# Patient Record
Sex: Male | Born: 1935 | Race: White | Hispanic: No | Smoking: Never smoker
Health system: Southern US, Community
[De-identification: ages and names within clinical notes are randomized; demographics above are authoritative.]

## PROBLEM LIST (undated history)

## (undated) DIAGNOSIS — H269 Unspecified cataract: Secondary | ICD-10-CM

## (undated) DIAGNOSIS — I639 Cerebral infarction, unspecified: Secondary | ICD-10-CM

## (undated) DIAGNOSIS — H579 Unspecified disorder of eye and adnexa: Secondary | ICD-10-CM

## (undated) DIAGNOSIS — I10 Essential (primary) hypertension: Secondary | ICD-10-CM

## (undated) DIAGNOSIS — E119 Type 2 diabetes mellitus without complications: Secondary | ICD-10-CM

## (undated) DIAGNOSIS — E785 Hyperlipidemia, unspecified: Secondary | ICD-10-CM

## (undated) DIAGNOSIS — F0390 Unspecified dementia without behavioral disturbance: Secondary | ICD-10-CM

## (undated) DIAGNOSIS — R32 Unspecified urinary incontinence: Secondary | ICD-10-CM

## (undated) DIAGNOSIS — J302 Other seasonal allergic rhinitis: Secondary | ICD-10-CM

## (undated) HISTORY — PX: CATARACT EXTRACTION: SUR2

---

## 1988-01-16 DIAGNOSIS — I639 Cerebral infarction, unspecified: Secondary | ICD-10-CM

## 1988-01-16 HISTORY — DX: Cerebral infarction, unspecified: I63.9

## 2011-01-01 ENCOUNTER — Emergency Department: Payer: Self-pay | Admitting: *Deleted

## 2011-05-08 ENCOUNTER — Ambulatory Visit: Payer: Self-pay | Admitting: Ophthalmology

## 2011-05-08 DIAGNOSIS — Z01810 Encounter for preprocedural cardiovascular examination: Secondary | ICD-10-CM

## 2011-05-22 ENCOUNTER — Ambulatory Visit: Payer: Self-pay | Admitting: Ophthalmology

## 2011-05-30 ENCOUNTER — Ambulatory Visit: Payer: Self-pay | Admitting: Ophthalmology

## 2011-07-04 ENCOUNTER — Ambulatory Visit: Payer: Self-pay | Admitting: Family Medicine

## 2011-10-16 ENCOUNTER — Emergency Department (HOSPITAL_COMMUNITY): Payer: Medicare Other

## 2011-10-16 ENCOUNTER — Inpatient Hospital Stay (HOSPITAL_COMMUNITY)
Admission: EM | Admit: 2011-10-16 | Discharge: 2011-10-18 | DRG: 069 | Disposition: A | Discharge status: Home Health | Payer: Medicare Other | Attending: Internal Medicine | Admitting: Internal Medicine

## 2011-10-16 ENCOUNTER — Encounter (HOSPITAL_COMMUNITY): Payer: Self-pay | Admitting: Physician Assistant

## 2011-10-16 DIAGNOSIS — A499 Bacterial infection, unspecified: Secondary | ICD-10-CM | POA: Diagnosis present

## 2011-10-16 DIAGNOSIS — Z79899 Other long term (current) drug therapy: Secondary | ICD-10-CM

## 2011-10-16 DIAGNOSIS — I10 Essential (primary) hypertension: Secondary | ICD-10-CM | POA: Diagnosis present

## 2011-10-16 DIAGNOSIS — B9689 Other specified bacterial agents as the cause of diseases classified elsewhere: Secondary | ICD-10-CM | POA: Diagnosis present

## 2011-10-16 DIAGNOSIS — E785 Hyperlipidemia, unspecified: Secondary | ICD-10-CM | POA: Diagnosis present

## 2011-10-16 DIAGNOSIS — Z7902 Long term (current) use of antithrombotics/antiplatelets: Secondary | ICD-10-CM

## 2011-10-16 DIAGNOSIS — J302 Other seasonal allergic rhinitis: Secondary | ICD-10-CM | POA: Diagnosis present

## 2011-10-16 DIAGNOSIS — R339 Retention of urine, unspecified: Secondary | ICD-10-CM

## 2011-10-16 DIAGNOSIS — H269 Unspecified cataract: Secondary | ICD-10-CM | POA: Diagnosis present

## 2011-10-16 DIAGNOSIS — R32 Unspecified urinary incontinence: Secondary | ICD-10-CM | POA: Diagnosis present

## 2011-10-16 DIAGNOSIS — E119 Type 2 diabetes mellitus without complications: Secondary | ICD-10-CM | POA: Diagnosis present

## 2011-10-16 DIAGNOSIS — H10029 Other mucopurulent conjunctivitis, unspecified eye: Secondary | ICD-10-CM | POA: Diagnosis present

## 2011-10-16 DIAGNOSIS — I639 Cerebral infarction, unspecified: Secondary | ICD-10-CM | POA: Diagnosis present

## 2011-10-16 DIAGNOSIS — Z8673 Personal history of transient ischemic attack (TIA), and cerebral infarction without residual deficits: Secondary | ICD-10-CM

## 2011-10-16 DIAGNOSIS — H109 Unspecified conjunctivitis: Secondary | ICD-10-CM

## 2011-10-16 DIAGNOSIS — I6350 Cerebral infarction due to unspecified occlusion or stenosis of unspecified cerebral artery: Secondary | ICD-10-CM

## 2011-10-16 DIAGNOSIS — Z7982 Long term (current) use of aspirin: Secondary | ICD-10-CM

## 2011-10-16 DIAGNOSIS — G459 Transient cerebral ischemic attack, unspecified: Principal | ICD-10-CM

## 2011-10-16 DIAGNOSIS — H579 Unspecified disorder of eye and adnexa: Secondary | ICD-10-CM | POA: Diagnosis present

## 2011-10-16 DIAGNOSIS — F0390 Unspecified dementia without behavioral disturbance: Secondary | ICD-10-CM | POA: Diagnosis present

## 2011-10-16 DIAGNOSIS — D649 Anemia, unspecified: Secondary | ICD-10-CM

## 2011-10-16 HISTORY — DX: Essential (primary) hypertension: I10

## 2011-10-16 HISTORY — DX: Hyperlipidemia, unspecified: E78.5

## 2011-10-16 HISTORY — DX: Cerebral infarction, unspecified: I63.9

## 2011-10-16 HISTORY — DX: Unspecified dementia, unspecified severity, without behavioral disturbance, psychotic disturbance, mood disturbance, and anxiety: F03.90

## 2011-10-16 HISTORY — DX: Unspecified urinary incontinence: R32

## 2011-10-16 HISTORY — DX: Unspecified cataract: H26.9

## 2011-10-16 HISTORY — DX: Unspecified disorder of eye and adnexa: H57.9

## 2011-10-16 HISTORY — DX: Type 2 diabetes mellitus without complications: E11.9

## 2011-10-16 HISTORY — DX: Other seasonal allergic rhinitis: J30.2

## 2011-10-16 LAB — CBC WITH DIFFERENTIAL/PLATELET
Basophils Relative: 0 % (ref 0–1)
Eosinophils Absolute: 0.2 10*3/uL (ref 0.0–0.7)
Hemoglobin: 11.3 g/dL — ABNORMAL LOW (ref 13.0–17.0)
Lymphs Abs: 2 10*3/uL (ref 0.7–4.0)
MCH: 30 pg (ref 26.0–34.0)
MCHC: 33.4 g/dL (ref 30.0–36.0)
Neutro Abs: 6.7 10*3/uL (ref 1.7–7.7)
Neutrophils Relative %: 69 % (ref 43–77)
Platelets: 228 10*3/uL (ref 150–400)
RBC: 3.77 MIL/uL — ABNORMAL LOW (ref 4.22–5.81)

## 2011-10-16 LAB — URINALYSIS, ROUTINE W REFLEX MICROSCOPIC
Bilirubin Urine: NEGATIVE
Glucose, UA: NEGATIVE mg/dL
Hgb urine dipstick: NEGATIVE
Protein, ur: NEGATIVE mg/dL
Urobilinogen, UA: 1 mg/dL (ref 0.0–1.0)

## 2011-10-16 LAB — GLUCOSE, CAPILLARY: Glucose-Capillary: 91 mg/dL (ref 70–99)

## 2011-10-16 LAB — COMPREHENSIVE METABOLIC PANEL
ALT: 21 U/L (ref 0–53)
Albumin: 3.8 g/dL (ref 3.5–5.2)
Alkaline Phosphatase: 73 U/L (ref 39–117)
Chloride: 103 mEq/L (ref 96–112)
Potassium: 4.1 mEq/L (ref 3.5–5.1)
Sodium: 141 mEq/L (ref 135–145)
Total Bilirubin: 0.4 mg/dL (ref 0.3–1.2)
Total Protein: 6.2 g/dL (ref 6.0–8.3)

## 2011-10-16 MED ORDER — CLOPIDOGREL BISULFATE 75 MG PO TABS
75.0000 mg | ORAL_TABLET | Freq: Every day | ORAL | Status: DC
  Administered 2011-10-17 – 2011-10-18 (×2): 75 mg via ORAL
  Filled 2011-10-16 (×2): qty 1

## 2011-10-16 MED ORDER — ASPIRIN EC 81 MG PO TBEC
81.0000 mg | DELAYED_RELEASE_TABLET | Freq: Every day | ORAL | Status: DC
  Administered 2011-10-17 – 2011-10-18 (×2): 81 mg via ORAL
  Filled 2011-10-16 (×2): qty 1

## 2011-10-16 MED ORDER — INSULIN ASPART 100 UNIT/ML ~~LOC~~ SOLN
0.0000 [IU] | Freq: Three times a day (TID) | SUBCUTANEOUS | Status: DC
  Administered 2011-10-17: 2 [IU] via SUBCUTANEOUS

## 2011-10-16 MED ORDER — SODIUM CHLORIDE 0.9 % IV SOLN: INTRAVENOUS | Status: AC

## 2011-10-16 MED ORDER — LORATADINE 10 MG PO TABS
10.0000 mg | ORAL_TABLET | Freq: Every day | ORAL | Status: DC
  Administered 2011-10-17 – 2011-10-18 (×2): 10 mg via ORAL
  Filled 2011-10-16 (×2): qty 1

## 2011-10-16 MED ORDER — CARVEDILOL 6.25 MG PO TABS
6.2500 mg | ORAL_TABLET | Freq: Two times a day (BID) | ORAL | Status: DC
  Administered 2011-10-17 – 2011-10-18 (×2): 6.25 mg via ORAL
  Filled 2011-10-16 (×5): qty 1

## 2011-10-16 MED ORDER — CITALOPRAM HYDROBROMIDE 20 MG PO TABS
20.0000 mg | ORAL_TABLET | Freq: Every day | ORAL | Status: DC
  Administered 2011-10-17 – 2011-10-18 (×2): 20 mg via ORAL
  Filled 2011-10-16 (×2): qty 1

## 2011-10-16 MED ORDER — SENNOSIDES-DOCUSATE SODIUM 8.6-50 MG PO TABS: 1.0000 | ORAL_TABLET | Freq: Every evening | ORAL | Status: DC | PRN

## 2011-10-16 MED ORDER — HYDRALAZINE HCL 20 MG/ML IJ SOLN
5.0000 mg | INTRAMUSCULAR | Status: DC | PRN
  Filled 2011-10-16: qty 0.25

## 2011-10-16 MED ORDER — ACETAMINOPHEN 325 MG PO TABS: 650.0000 mg | ORAL_TABLET | ORAL | Status: DC | PRN

## 2011-10-16 MED ORDER — ADULT MULTIVITAMIN W/MINERALS CH
1.0000 | ORAL_TABLET | Freq: Every day | ORAL | Status: DC
  Administered 2011-10-17 – 2011-10-18 (×2): 1 via ORAL
  Filled 2011-10-16 (×2): qty 1

## 2011-10-16 MED ORDER — ONDANSETRON HCL 4 MG/2ML IJ SOLN: 4.0000 mg | Freq: Four times a day (QID) | INTRAMUSCULAR | Status: DC | PRN

## 2011-10-16 MED ORDER — ACETAMINOPHEN 650 MG RE SUPP: 650.0000 mg | RECTAL | Status: DC | PRN

## 2011-10-16 MED ORDER — LISINOPRIL 5 MG PO TABS: 5.0000 mg | ORAL_TABLET | Freq: Every day | ORAL | Status: DC

## 2011-10-16 MED ORDER — ATORVASTATIN CALCIUM 20 MG PO TABS
20.0000 mg | ORAL_TABLET | Freq: Every day | ORAL | Status: DC
  Administered 2011-10-16 – 2011-10-17 (×2): 20 mg via ORAL
  Filled 2011-10-16 (×3): qty 1

## 2011-10-16 MED ORDER — POLYMYXIN B-TRIMETHOPRIM 10000-0.1 UNIT/ML-% OP SOLN
2.0000 [drp] | OPHTHALMIC | Status: DC
  Administered 2011-10-16 – 2011-10-18 (×7): 2 [drp] via OPHTHALMIC
  Filled 2011-10-16: qty 10

## 2011-10-16 NOTE — ED Notes (Signed)
Per family, pt has been drinking diet coke all day but has had nothing to eat and has had none of his medications today.  CBG to be taken.

## 2011-10-16 NOTE — Progress Notes (Signed)
Disposition Note  Christopher Pineda, is a 78 y.o. male,   MRN: [id number]  -  DOB - Dec 19, 1926  Outpatient Primary MD for the patient is No primary provider on file.   Blood pressure 170/106, pulse 65, temperature 98 F (36.7 C), temperature source Oral, resp. rate 18, height 5\' 9"  (1.753 m), weight 77.111 kg (170 lb), SpO2 96.00%.  Active Problems:  * No active hospital problems. *     78 yo from [place] with history of prior stroke and dementia.  Family lives next door to him and cares for him.  They called EMS today as he was not making sense in conversation and was disorientated.  He also had right sided weakness.  Per the EDP these symptoms have been transient.  CT head shows multiple areas of remote water shed infarcts and possible cervical vascular compromise.    I have requested a tele bed for TIA work up and possibly MRI brain and neck.   Algis Downs, PA-C [facility] Pager: [phone]

## 2011-10-16 NOTE — ED Notes (Addendum)
MD in room at this time. Family arrived. Family reports pt had increase in confusion and right side weakness earlier this AM that s/s lasted "few minutes" then resolved.

## 2011-10-16 NOTE — ED Notes (Signed)
Called flow.  She stated Dr Malachi Bonds to see pt.  She is calling RE when pt will see pt.

## 2011-10-16 NOTE — ED Provider Notes (Signed)
History     CSN: [id number]  Arrival date & time 10/16/11  1043   First MD Initiated Contact with Patient 10/16/11 1100      Chief Complaint  Patient presents with  . Altered Mental Status    (Consider location/radiation/quality/duration/timing/severity/associated sxs/prior treatment) Patient is a 78 y.o. male presenting with altered mental status. The history is provided by the patient and a relative.  Altered Mental Status   patient here with altered mental status consisting of right-sided weakness and confusion which is been transient today. Symptoms lasted for approximate one hour and consisted of ataxia with confusion and right-sided weakness. History of CVA 15 years ago. Also has a history of Alzheimer's and according to the family patient's dementia symptoms wax and wane. They note recent strong smelling urine. Denies any recent fevers or chills or vomiting. EMS was called and patient transported here  No past medical history on file.  No past surgical history on file.  No family history on file.  History  Substance Use Topics  . Smoking status: Not on file  . Smokeless tobacco: Not on file  . Alcohol Use: Not on file      Review of Systems  Psychiatric/Behavioral: Positive for altered mental status.  All other systems reviewed and are negative.    Allergies  Review of patient's allergies indicates not on file.  Home Medications  No current outpatient prescriptions on file.  BP 181/67  Pulse 65  Temp 98.2 F (36.8 C) (Oral)  Resp 18  Ht 5\' 9"  (1.753 m)  Wt 170 lb (77.111 kg)  BMI 25.10 kg/m2  SpO2 100%  Physical Exam  Nursing note and vitals reviewed. Constitutional: He appears well-developed and well-nourished.  Non-toxic appearance. No distress.  HENT:  Head: Normocephalic and atraumatic.  Eyes: Conjunctivae normal, EOM and lids are normal. Pupils are equal, round, and reactive to light.  Neck: Normal range of motion. Neck supple. No tracheal  deviation present. No mass present.  Cardiovascular: Normal rate, regular rhythm and normal heart sounds.  Exam reveals no gallop.   No murmur heard. Pulmonary/Chest: Effort normal and breath sounds normal. No stridor. No respiratory distress. He has no decreased breath sounds. He has no wheezes. He has no rhonchi. He has no rales.  Abdominal: Soft. Normal appearance and bowel sounds are normal. He exhibits no distension. There is no tenderness. There is no rebound and no CVA tenderness.  Musculoskeletal: Normal range of motion. He exhibits no edema and no tenderness.  Neurological: He is alert. He displays no tremor. No cranial nerve deficit or sensory deficit. He displays no seizure activity. GCS eye subscore is 4. GCS verbal subscore is 5. GCS motor subscore is 6.  Skin: Skin is warm and dry. No abrasion and no rash noted.  Psychiatric: His affect is blunt. His speech is not slurred. He is slowed. He is communicative.    ED Course  Procedures (including critical care time)   Labs Reviewed  URINALYSIS, ROUTINE W REFLEX MICROSCOPIC  URINE CULTURE  CBC WITH DIFFERENTIAL  COMPREHENSIVE METABOLIC PANEL  PROTIME-INR   No results found.   No diagnosis found.    MDM   Date: 10/16/2011  Rate: 64  Rhythm: normal sinus rhythm  QRS Axis: normal  Intervals: normal  ST/T Wave abnormalities: normal  Conduction Disutrbances:none  Narrative Interpretation:   Old EKG Reviewed: none available  4:49 PM Patient to be admitted for evaluation of possible TIA like symptoms  Toy Baker, MD 10/16/11 [phone]

## 2011-10-16 NOTE — ED Notes (Signed)
CBG 91; RN aware 

## 2011-10-16 NOTE — ED Notes (Addendum)
Pt to CT/XR.

## 2011-10-16 NOTE — ED Notes (Addendum)
Pt refused rectal temp and given diet drink

## 2011-10-16 NOTE — ED Notes (Addendum)
EMS called by pt daughter for AMS. Daughter reported pt was "normal" last night when she last saw him and when she went to pt home this AM pt was "not right". She reported pt was not making sense with conversation. Pt has dementia.  Pt alert and disoriented upon ED arrival. No family with pt

## 2011-10-16 NOTE — H&P (Addendum)
[facility] History and Physical  Christopher Pineda ZOX:[id number] DOB: 10-12-1926 DOA: 10/16/2011  Referring physician:  Dr. Freida Busman PCP: No primary provider on file.   Dr. Ramond [facility]   Chief Complaint: Altered mental status  HPI:  The patient is an 78 year old male with HTN, HLD, T2DM, dementia, and history of CVA who presents with slurred speech, and generalized weakness.  Last night the patient was at his baseline of A&Ox2.  Per family, he normally has to be woken up in the morning, but this morning, when they came over, he was sitting out on the porch and his speech was very difficult to understand.  Daughter handed him coffee, but he could not hold his coffee properly - it kept tipping.  His son came over and by that time his speech was even worse so they called 911.  When EMS arrived, his speech was very slurred and he was unable to hold up both arms.  No facial droop.  His gait was lurching and he seemed like he was going to fall.  He needed assistance getting his shoes on.  EMS stated that his blood pressure was normal and he started to get better even while they were getting him in the ambulance.    When he arrived in the ER, he was laughing and talking and speaking normally and he has been fine okay.  He had a head CT which showed a multitude of infarcts including watershed infarcts which appeared remote.  His electrolytes were notable for an elevated BUN and mild normocytic anemia.  His urinalysis was unremarkable, but he was retaining of urine and had a foley catheter placed.  EKG was NSR.    Review of Systems:   He states he has been feeling well.  Denies fevers, chills, weight loss, night sweats, cold symptoms.  He has chronic eye irritation from his eyelashes turning inward with purulent discharge and interference with vision.  Denies hearing problems.  Denies chest pain, shortness of breath, nausea, vomiting, diarrhea, abdominal pain, constipation, blood  in stools, melena.  His urine smells very strong.  He is incontinent at night and voids rarely during the day.  He wears depends.  Denies rashes, ulcers, lymphadenopathy, abnormal bruising or bleeding, depression, anxiety.    Past Medical History  Diagnosis Date  . CVA (cerebral infarction) 1990  . Hypertension   . Hyperlipidemia   . Diabetes mellitus type 2 in nonobese   . Seasonal allergies   . Cataracts, bilateral   . Eye disease     eyelashes rub against eye and cause irritation  . Incontinence of urine   . Dementia     Past Surgical History  Procedure Date  . Cataract extraction    Social History:  reports that he has never smoked. He has quit using smokeless tobacco. His smokeless tobacco use included Chew. He reports that he does not drink alcohol or use illicit drugs.  He has baseline dementia and is usually confused in the morning.  He is oriented usually to person, place, but he does not know day or year.  He uses a cane to ambulate.  He lives alone and his son stays with him at night.  He has wandered before.  Son lives right next door and the rest of the family lives near him.  He does not cook and he is brought food by his family.     No Known Allergies  Family History  Problem Relation Age  of Onset  . Heart attack Mother   . Cancer Sister   . Alzheimer's disease Sister   . Alzheimer's disease Sister   . Alzheimer's disease Brother   . Cancer Daughter        Prior to Admission medications   Medication Sig Start Date End Date Taking? Authorizing Provider  aspirin EC 81 MG tablet Take 81 mg by mouth daily.   Yes Historical Provider, MD  atorvastatin (LIPITOR) 20 MG tablet Take 20 mg by mouth at bedtime.   Yes Historical Provider, MD  carvedilol (COREG) 6.25 MG tablet Take 6.25 mg by mouth 2 (two) times daily with a meal.   Yes Historical Provider, MD  cetirizine (ZYRTEC) 10 MG tablet Take 10 mg by mouth daily.   Yes Historical Provider, MD  citalopram (CELEXA)  20 MG tablet Take 20 mg by mouth daily.   Yes Historical Provider, MD  clopidogrel (PLAVIX) 75 MG tablet Take 75 mg by mouth daily.   Yes Historical Provider, MD  metFORMIN (GLUCOPHAGE) 1000 MG tablet Take 1,000 mg by mouth 2 (two) times daily with a meal.   Yes Historical Provider, MD  Multiple Vitamin (MULTIVITAMIN WITH MINERALS) TABS Take 1 tablet by mouth daily.   Yes Historical Provider, MD  quinapril (ACCUPRIL) 10 MG tablet Take 10 mg by mouth at bedtime.   Yes Historical Provider, MD   Physical Exam: Filed Vitals:   10/16/11 1730 10/16/11 1830 10/16/11 1837 10/16/11 2054  BP: 109/72 173/126 109/72 168/75  Pulse: 59 58  62  Temp:    98.4 F (36.9 C)  TempSrc:    Oral  Resp: 16 14 16 16   Height:    5\' 9"  (1.753 m)  Weight:    68 kg (149 lb 14.6 oz)  SpO2: 94% 96% 97% 99%     General:  Caucasian male, no acute distress, lying on bed  Eyes:  Right eye with mild erythema of eyelid and patient unable to open eye without assistance.  Purulent discharge from eye and eyelashes turned under lower leg abrading corner and very injected.  Left eye has copious purulent drainage but is less injected.  Pupils are grossly symmetric and reactive to light, but difficult to assess.  EOMI intact left eye, but difficult to assess in right eye.    ENT:  Nares and oropharynx nonerythematous, no exudate or plaque. Edentulous  Neck: supple, bruits bilaterally versus radiation from heart, no thyromegaly or nodules  Cardiovascular: RRR, 3/6 c-d systolic murmur at RUSB with radiation to carotids, 2+ pulses  Respiratory: CTAB  Abdomen: NABS, soft, nondistended, fullness midline and to the left, without tenderness, rebound, or guardingi  Skin:  Rash that looks like seborrheic dermatitis on ears and eyelids.  Mild skin tenting.    Musculoskeletal: Normal tone and bulk  Psychiatric: A&O only to person.  Unsure date, year and location.  Says "bed" when asked where he is.  Smiling and  pleasant  Neurologic: No facial droop, tongue and palate midline, sensation intact on cheeks.  Strength 5/5 throughout, 2+ patellar reflexes, sensation intact to light touch.  Unable to assess finger to nose as patient cannot open right eye and has no depth perception.  Rapid finger tap is brisk and faster on the right than the left.  (right handed)  Labs on Admission:  Basic Metabolic Panel:  Lab 10/16/11 1610  NA 141  K 4.1  CL 103  CO2 30  GLUCOSE 134*  BUN 25*  CREATININE 1.04  CALCIUM  9.5  MG --  PHOS --   Liver Function Tests:  Lab 10/16/11 1112  AST 20  ALT 21  ALKPHOS 73  BILITOT 0.4  PROT 6.2  ALBUMIN 3.8   No results found for this basename: LIPASE:5,AMYLASE:5 in the last 168 hours No results found for this basename: AMMONIA:5 in the last 168 hours CBC:  Lab 10/16/11 1112  WBC 9.6  NEUTROABS 6.7  HGB 11.3*  HCT 33.8*  MCV 89.7  PLT 228   Cardiac Enzymes: No results found for this basename: CKTOTAL:5,CKMB:5,CKMBINDEX:5,TROPONINI:5 in the last 168 hours  BNP (last 3 results) No results found for this basename: PROBNP:3 in the last 8760 hours CBG:  Lab 10/16/11 2008 10/16/11 1542  GLUCAP 91 109*    Radiological Exams on Admission: Dg Chest 2 View  10/16/2011  *RADIOLOGY REPORT*  Clinical Data: Possible chest pain.  Confusion.  CHEST - 2 VIEW  Comparison: None.  Findings: Moderate thoracic spondylosis throughout. Lateral view degraded by patient arm position.  Numerous leads and wires project over the chest.  Remote left clavicular fracture.  Mild cardiomegaly with atherosclerosis in the transverse aorta. No pleural effusion or pneumothorax.  Clear lungs.  IMPRESSION: Mild cardiomegaly without acute disease.   Original Report Authenticated By: Consuello Bossier, M.D.    Ct Head Wo Contrast  10/16/2011  *RADIOLOGY REPORT*  Clinical Data: Episode of increased confusion and right-sided weakness which has since resolved.  Pain.  CT HEAD WITHOUT CONTRAST   Technique:  Contiguous axial images were obtained from the base of the skull through the vertex without contrast.  Comparison: None available.  Findings: Moderate generalized atrophy is present.  Extensive confluent white matter hypoattenuation is evident bilaterally, left greater than right.  There is some loss of gray-white differentiation within the anterior left frontal lobe, best seen on image 26 of series 2. Similar loss of gray-white differentiation and thinning is evident posteriorly, also best seen on image 26. There is some volume loss as well suggesting these are chronic findings.  A remote lacunar infarct is present in the right lentiform nucleus. The basal ganglia are grossly intact otherwise.  Insular cortex is intact bilaterally.  A remote lacunar infarct is evident within the right corona radiata.  There is chronic opacification of the right maxillary sinus. Posterior ethmoid air cells are also opacified on the right.  There is evidence of chronic disease without active disease in the right sphenoid sinus.  The mastoid air cells are clear.  The osseous skull is intact.  Atherosclerotic calcifications are evident in the cavernous carotid arteries bilaterally.  IMPRESSION:  1.  Asymmetric white matter disease and watershed type infarcts of the left frontal and parietal lobes.  These appear to be remote there is angle agent termini without comparison studies. 2.  Remote lacunar infarcts of the basal ganglia and right corona radiata. 3.  Atrophy and extensive white matter disease. 4.  Chronic sinus disease as described. 5.  The watershed type changes on the left suggest the possibility of the cervical vascular compromise.  If the patient has ongoing symptoms, MRI of the brain as well as MRA of the head and neck may be useful for further evaluation.   Original Report Authenticated By: Jamesetta Orleans. MATTERN, M.D.     EKG: NSR  Assessment/Plan Principal Problem:  *TIA (transient ischemic  attack) Active Problems:  CVA (cerebral infarction)  Hypertension  Hyperlipidemia  Diabetes mellitus type 2 in nonobese  Seasonal allergies  Cataracts, bilateral  Eye disease  Incontinence of urine  Dementia  Conjunctivitis  Urinary retention   TIA:  Patient has history of CVA and multiple risk factors for stroke.  Admit to rule out stroke and optimize risk factors.  No evidence of UTI on UA and patient was not orthostatic.   -  Neuro checks -  Telemetry -  MRI/MRA head -  Carotid dupex -  ECHO -  Continue ASA 81mg  and plavix 75 mg -  Lipid panel in AM -  A1c in AM -  Will need neurology consultation if acute stroke on MRI -  PT/OT/ST -  Advance diet as tolerated  Dementia:  -  TSH, B12, RPR.  -  Please consent family for HIV testing  Conjunctivitis:  Purulent and likely bacterial superinfection caused by chronic eye irritation from eyelashes -  Culture -  Polytrim -  Needs close outpatient ophthalmology follow up.  Urinary retention:   -  Voiding trial with PVR in AM -  Consider addition of fast-acting BPH med if retaining.  HTN:  Elevated blood pressures, then low.  Labile.   -  Hold ACEI tonight in setting of suspected acute stroke to allow permissive hypertension -  Consider restarting ACEI if blood pressures consistently elevated -  Continue beta blocker to prevent rebound tachycardia - Hydralazine prn  HLD:   -  F/u lipid panel -  Continue statin  DM:  Patient compliant with medications but noncompliant with diet.  On ACEI.   -  Hold metformin as getting imaging -  SSI   Anemia, normocytic, mild.  -  Trend  DIET:  Advance as tolerated to carbohydrate controlled, healthy heart ACCESS:  PIV IVF:  NS at 174ml/h (appears dry)  END at 10AM on 10/2 PROPH:  SCDS  Code Status: Full code per patient and family Family Communication:  Carlo Guevarra [phone], daughter-in-law is primary contact.  Did not speak with her, but spoke with two family members who  were present at bedside.   Disposition Plan: Pending completion of CVA work up  Time spent: 45 min  Renae Fickle [facility] Pager [phone]  If 7PM-7AM, please contact night-coverage [email or web address] Password [email or web address] 10/16/2011, 10:42 PM

## 2011-10-16 NOTE — ED Notes (Signed)
Dr Malachi Bonds stated to have EDP place temporary orders and pt can go upstairs.

## 2011-10-17 ENCOUNTER — Inpatient Hospital Stay (HOSPITAL_COMMUNITY): Payer: Medicare Other

## 2011-10-17 ENCOUNTER — Encounter (HOSPITAL_COMMUNITY): Payer: Self-pay | Admitting: *Deleted

## 2011-10-17 DIAGNOSIS — F0390 Unspecified dementia without behavioral disturbance: Secondary | ICD-10-CM

## 2011-10-17 DIAGNOSIS — E119 Type 2 diabetes mellitus without complications: Secondary | ICD-10-CM

## 2011-10-17 DIAGNOSIS — G459 Transient cerebral ischemic attack, unspecified: Principal | ICD-10-CM

## 2011-10-17 DIAGNOSIS — R339 Retention of urine, unspecified: Secondary | ICD-10-CM

## 2011-10-17 DIAGNOSIS — I517 Cardiomegaly: Secondary | ICD-10-CM

## 2011-10-17 LAB — URINE CULTURE
Colony Count: NO GROWTH
Culture: NO GROWTH

## 2011-10-17 LAB — LIPID PANEL: LDL Cholesterol: 64 mg/dL (ref 0–99)

## 2011-10-17 LAB — VITAMIN B12: Vitamin B-12: 240 pg/mL (ref 211–911)

## 2011-10-17 LAB — GLUCOSE, CAPILLARY
Glucose-Capillary: 83 mg/dL (ref 70–99)
Glucose-Capillary: 172 mg/dL — ABNORMAL HIGH (ref 70–99)

## 2011-10-17 LAB — TSH: TSH: 3.012 u[IU]/mL (ref 0.350–4.500)

## 2011-10-17 LAB — HEMOGLOBIN A1C
Hgb A1c MFr Bld: 6.1 % — ABNORMAL HIGH (ref <5.7)
Mean Plasma Glucose: 128 mg/dL — ABNORMAL HIGH (ref <117)

## 2011-10-17 MED ORDER — OLANZAPINE 5 MG PO TBDP
5.0000 mg | ORAL_TABLET | Freq: Every day | ORAL | Status: DC
  Administered 2011-10-17: 5 mg via ORAL
  Filled 2011-10-17 (×2): qty 1

## 2011-10-17 MED ORDER — LORAZEPAM 2 MG/ML IJ SOLN
1.0000 mg | Freq: Once | INTRAMUSCULAR | Status: AC
  Administered 2011-10-17: 1 mg via INTRAVENOUS
  Filled 2011-10-17: qty 1

## 2011-10-17 MED ORDER — INFLUENZA VIRUS VACC SPLIT PF IM SUSP
0.5000 mL | Freq: Once | INTRAMUSCULAR | Status: DC
  Filled 2011-10-17: qty 0.5

## 2011-10-17 MED ORDER — VITAMIN B-12 1000 MCG PO TABS
1000.0000 ug | ORAL_TABLET | Freq: Every day | ORAL | Status: DC
  Administered 2011-10-17 – 2011-10-18 (×2): 1000 ug via ORAL
  Filled 2011-10-17 (×2): qty 1

## 2011-10-17 MED ORDER — PNEUMOCOCCAL VAC POLYVALENT 25 MCG/0.5ML IJ INJ
0.5000 mL | INJECTION | INTRAMUSCULAR | Status: DC
  Filled 2011-10-17: qty 0.5

## 2011-10-17 NOTE — Evaluation (Signed)
Occupational Therapy Evaluation Patient Details Name: Christopher Pineda MRN: [id number] DOB: [birth year] Today's Date: 10/17/2011 Time: 1330-1350 OT Time Calculation (min): 20 min  OT Assessment / Plan / Recommendation Clinical Impression  Pt admitted with slurred speech. MRI negative for acute infarct.  Pt demonstrates with significant cognitive deficits during eval session, unsure whether this is due to ativan administered by RN earlier in AM.  At this time, recommending SNF as pt will require 24/7 assist/supervision. Will benefit from acute OT to address below problem list.     OT Assessment  Patient needs continued OT Services    Follow Up Recommendations  Skilled nursing facility    Barriers to Discharge      Equipment Recommendations  None recommended by OT    Recommendations for Other Services    Frequency  Min 2X/week    Precautions / Restrictions Precautions Precautions: Fall Precaution Comments: confused   Pertinent Vitals/Pain See vitals    ADL  Grooming: Performed;Wash/dry face;Maximal assistance Where Assessed - Grooming: Supported sitting Upper Body Dressing: Performed;Maximal assistance Where Assessed - Upper Body Dressing: Supported sitting Toilet Transfer: Simulated;+2 Total assistance Toilet Transfer: Patient Percentage: 50% Statistician Method: Sit to stand;Stand pivot Acupuncturist:  (bed to chair) Equipment Used: Gait belt Transfers/Ambulation Related to ADLs: +2 assist during transfers. Pt very confused and resistant to intiating OOB transfer. ADL Comments: Pt very limited by cognitive deficits.  Pt continually reaching out to touch therapist or floor when attempting to perform UB dressing.      OT Diagnosis: Generalized weakness;Cognitive deficits  OT Problem List: Impaired balance (sitting and/or standing);Decreased cognition;Decreased safety awareness OT Treatment Interventions: Self-care/ADL training;Therapeutic activities;Cognitive  remediation/compensation;Patient/family education;Balance training   OT Goals Acute Rehab OT Goals OT Goal Formulation: With patient/family Time For Goal Achievement: 10/24/11 Potential to Achieve Goals: Good ADL Goals Pt Will Perform Upper Body Bathing: with set-up;Sitting, chair;Sitting, edge of bed ADL Goal: Upper Body Bathing - Progress: Goal set today Pt Will Perform Lower Body Bathing: with set-up;Sitting, chair;Sitting, edge of bed ADL Goal: Lower Body Bathing - Progress: Goal set today Pt Will Transfer to Toilet: with supervision;Ambulation;Comfort height toilet ADL Goal: Toilet Transfer - Progress: Goal set today Miscellaneous OT Goals Miscellaneous OT Goal #1: Pt will demonstrate selective attention during all ADL activity. OT Goal: Miscellaneous Goal #1 - Progress: Goal set today Miscellaneous OT Goal #2: Pt will perform static sitting balance task sitting EOB >5 min in prep for ADLs. OT Goal: Miscellaneous Goal #2 - Progress: Goal set today  Visit Information  Last OT Received On: 10/17/11 Assistance Needed: +2    Subjective Data  Subjective: Chaney's left. He's gone.   Prior Functioning     Home Living Lives With: Alone Available Help at Discharge: Family;Available 24 hours/day Type of Home: House Home Access: Stairs to enter [employer] of Steps: 1 Bathroom Shower/Tub: Health visitor: Standard Home Adaptive Equipment: Shower chair without back;Walker - rolling Prior Function Level of Independence: Needs assistance Needs Assistance: Bathing;Dressing Bath: Supervision/set-up Dressing: Supervision/set-up Driving: No Vocation: Retired Comments: Family handles medications and food prep, family reports he is only home alone an hour at a time         Vision/Perception     Cognition  Overall Cognitive Status: Impaired Area of Impairment: Attention;Memory;Following commands;Safety/judgement;Awareness of errors;Awareness of  deficits;Problem solving;Executive functioning Arousal/Alertness: Awake/alert Orientation Level: Disoriented to;Time;Situation;Place Behavior During Session: Restless Current Attention Level: Focused Following Commands: Follows one step commands inconsistently Safety/Judgement: Decreased awareness of safety precautions;Decreased safety  judgement for tasks assessed;Impulsive;Decreased awareness of need for assistance Cognition - Other Comments: Resistant to most functional cues, likely difficulty understanding them. also appeared to be hallucinating    Extremity/Trunk Assessment Right Upper Extremity Assessment RUE ROM/Strength/Tone: [facility] for tasks assessed Left Upper Extremity Assessment LUE ROM/Strength/Tone: [facility] for tasks assessed Right Lower Extremity Assessment RLE ROM/Strength/Tone: Deficits;Unable to fully assess;Due to impaired cognition RLE ROM/Strength/Tone Deficits: grossly 3-4/5 Left Lower Extremity Assessment LLE ROM/Strength/Tone: Deficits;Unable to fully assess;Due to impaired cognition LLE ROM/Strength/Tone Deficits: grossly 3-4/5     Mobility Bed Mobility Bed Mobility: Right Sidelying to Sit Right Sidelying to Sit: 3: Mod assist Details for Bed Mobility Assistance: Pt resistant to sidelying->sit but able to sit with mod facilitation (possibly because pt did not understand commands?) Transfers Sit to Stand: 1: +2 Total assist;From bed;From chair/3-in-1 Sit to Stand: Patient Percentage: 50% Stand to Sit: 1: +2 Total assist;To chair/3-in-1 Stand to Sit: Patient Percentage: 60% Details for Transfer Assistance: Pt again having difficulty following commands and not initiating sit->stand with verbal cues, with rocking of upper trunk and facilitation at sacrum bilaterally pt stood but needed assist bilaterally for stability as pt's weight in his heels, sat without warning and needing facilitation at hips to pivot to chair     Shoulder Instructions     Exercise     Balance  Balance Balance Assessed: Yes Static Sitting Balance Static Sitting - Balance Support: Feet supported Static Sitting - Level of Assistance: 4: Min assist;3: Mod assist Static Sitting - Comment/# of Minutes: ~5 minutes. Pt required min-mod assist while sitting EOB and EOC due to restlessness/confusion. Pt continually anteriorly or to right side.   End of Session OT - End of Session Equipment Utilized During Treatment: Gait belt Activity Tolerance:  (limited by confusion) Patient left: in chair;with call bell/phone within reach;with family/visitor present Nurse Communication: Mobility status  GO    10/17/2011 Cipriano Mile OTR/L Pager [phone] Office [phone]  Cipriano Mile 10/17/2011, 5:06 PM

## 2011-10-17 NOTE — Evaluation (Signed)
Physical Therapy Evaluation Patient Details Name: Christopher Pineda MRN: [id number] DOB: 1926/11/16 Today's Date: 10/17/2011 Time: 4098-1191 PT Time Calculation (min): 21 min  PT Assessment / Plan / Recommendation Clinical Impression  Christopher Pineda is 78 y/o male admitted with slurred speech. MRI negative for acute infarct. Presents to PT with generalized weakness and cognitive deficits affecting his functional mobility and independence. Unable to determine if cognitive deficits are side effects from ativan given earlier in the day however pt's family reports this is not baseline. Will benefit phsyical therapy in the acute setting to address these and the below impairemtns so as to maximize functional independence and insure safe d/c to next venue. At this time would rec. SNF for f/u but will continue to follow and see how he is able to progress.     PT Assessment  Patient needs continued PT services    Follow Up Recommendations  Post acute inpatient rehab (likely SNF, will reassess 10/3 to reassess cognition and how it impacts his function)   Barriers to Discharge        Equipment Recommendations  None recommended by PT    Recommendations for Other Services     Frequency Min 3X/week    Precautions / Restrictions Precautions Precautions: Fall Precaution Comments: confused         Mobility  Bed Mobility Bed Mobility: Right Sidelying to Sit Right Sidelying to Sit: 3: Mod assist Details for Bed Mobility Assistance: Pt resistant to sidelying->sit but able to sit with mod facilitation (possibly because pt did not understand commands?) Transfers Transfers: Sit to Stand;Stand to Sit;Stand Pivot Transfers Sit to Stand: 1: +2 Total assist;From bed;From chair/3-in-1 Sit to Stand: Patient Percentage: 50% Stand to Sit: 1: +2 Total assist;To chair/3-in-1 Stand to Sit: Patient Percentage: 60% Stand Pivot Transfers: 1: +2 Total assist Stand Pivot Transfers: Patient Percentage: 50% Details  for Transfer Assistance: Pt again having difficulty following commands and not initiating sit->stand with verbal cues, with rocking of upper trunk and facilitation at sacrum bilaterally pt stood but needed assist bilaterally for stability as pt's weight in his heels, sat without warning and needing facilitation at hips to pivot to chair Ambulation/Gait Ambulation/Gait Assistance: Not tested (comment) Ambulation/Gait Assistance Details: pt too unsteady to ambulate today    Shoulder Instructions     Exercises     PT Diagnosis: Difficulty walking;Abnormality of gait;Generalized weakness;Altered mental status  PT Problem List: Decreased strength;Decreased activity tolerance;Decreased balance;Decreased mobility;Decreased cognition;Decreased knowledge of use of DME;Decreased safety awareness PT Treatment Interventions: DME instruction;Gait training;Functional mobility training;Therapeutic activities;Therapeutic exercise;Balance training;Neuromuscular re-education;Cognitive remediation;Patient/family education   PT Goals Acute Rehab PT Goals PT Goal Formulation: With patient/family Time For Goal Achievement: 10/24/11 Potential to Achieve Goals: Good Pt will go Supine/Side to Sit: with supervision PT Goal: Supine/Side to Sit - Progress: Goal set today Pt will Sit at Edge of Bed: with supervision;3-5 min;with bilateral upper extremity support PT Goal: Sit at Edge Of Bed - Progress: Goal set today Pt will go Sit to Supine/Side: with supervision PT Goal: Sit to Supine/Side - Progress: Goal set today Pt will go Sit to Stand: with supervision PT Goal: Sit to Stand - Progress: Goal set today Pt will go Stand to Sit: with supervision PT Goal: Stand to Sit - Progress: Goal set today Pt will Transfer Bed to Chair/Chair to Bed: with supervision PT Transfer Goal: Bed to Chair/Chair to Bed - Progress: Goal set today Pt will Ambulate: 1 - 15 feet;with min assist;with least restrictive assistive device PT  Goal: Ambulate - Progress: Goal set today  Visit Information  Last PT Received On: 10/17/11 Assistance Needed: +2    Subjective Data  Subjective: You're pretty.    Prior Functioning  Home Living Lives With: Alone Available Help at Discharge: Family;Available 24 hours/day Type of Home: House Home Access: Stairs to enter [employer] of Steps: 1 Bathroom Shower/Tub: Health visitor: Standard Home Adaptive Equipment: Shower chair without back;Walker - rolling Prior Function Level of Independence: Needs assistance Needs Assistance: Bathing;Dressing Bath: Supervision/set-up Dressing: Supervision/set-up Driving: No Vocation: Retired Comments: Family handles medications and food prep, family reports he is only home alone an hour at a time    Cognition  Overall Cognitive Status: Impaired Area of Impairment: Attention;Memory;Following commands;Safety/judgement;Awareness of errors;Awareness of deficits;Problem solving;Executive functioning Orientation Level: Disoriented to;Time;Situation;Place Following Commands: Follows one step commands inconsistently Safety/Judgement: Decreased awareness of safety precautions;Decreased safety judgement for tasks assessed;Impulsive;Decreased awareness of need for assistance Cognition - Other Comments: Resistant to most functional cues, likely difficulty understanding them. also appeared to be hallucinating    Extremity/Trunk Assessment Right Upper Extremity Assessment RUE ROM/Strength/Tone: [facility] for tasks assessed Left Upper Extremity Assessment LUE ROM/Strength/Tone: [facility] for tasks assessed Right Lower Extremity Assessment RLE ROM/Strength/Tone: Deficits;Unable to fully assess;Due to impaired cognition RLE ROM/Strength/Tone Deficits: grossly 3-4/5 Left Lower Extremity Assessment LLE ROM/Strength/Tone: Deficits;Unable to fully assess;Due to impaired cognition LLE ROM/Strength/Tone Deficits: grossly 3-4/5   Balance    End  of Session PT - End of Session Equipment Utilized During Treatment: Gait belt Activity Tolerance: Other (comment) (confusion? pt was given sedative prior to MRI) Patient left: in chair;with call bell/phone within reach;with family/visitor present  GP     [facility] HELEN 10/17/2011, 2:07 PM

## 2011-10-17 NOTE — Progress Notes (Addendum)
[facility] PROGRESS NOTE  Christopher Pineda ZOX:[id number] DOB: Oct 07, 1926 DOA: 10/16/2011 PCP: No primary provider on file.  Assessment/Plan: TIA: Patient has history of CVA and multiple risk factors for stroke. Admit to rule out stroke and optimize risk factors. No evidence of UTI on UA and patient was not orthostatic.  - Neuro checks  - Telemetry  - MRI/MRA head: No acute finding. Extensive chronic ischemic changes throughout  the brain as outlined above. - Carotid dupex pending  - ECHO - pending - Continue ASA 81mg  and plavix 75 mg  - Lipid panel: LDL< 80 - A1c: 6.1 - PT/OT/ST  - Advance diet as tolerated  NO TPA given upon presentation as outside timeframe w/ LKW night before  Dementia:  - TSH ok , B12 on lower end of normal, RPR negative   Conjunctivitis: Purulent and likely bacterial superinfection caused by chronic eye irritation from eyelashes  - Culture  - Polytrim  - Needs close outpatient ophthalmology follow up.   Urinary retention:  - Voiding trial with PVR in AM  - Consider addition of fast-acting BPH med if retaining.   HTN: Elevated blood pressures, then low. Labile.  - Hold ACEI tonight in setting of suspected acute stroke to allow permissive hypertension  - Consider restarting ACEI if blood pressures consistently elevated  - Continue beta blocker to prevent rebound tachycardia  - Hydralazine prn   HLD:  - F/u lipid panel  - Continue statin   DM: Patient compliant with medications but noncompliant with diet. On ACEI.  - Hold metformin as getting imaging  - SSI   Anemia, normocytic, mild.  - Trend   Code Status: full Family Communication: daughter at bedside Disposition Plan: home?    HPI/Subjective: Patient back from MRI- got 1 mg ativan-- still very  Sleepy: daughter says her dad sleeps during the day usually    Objective: Filed Vitals:   10/17/11 0205 10/17/11 0439 10/17/11 0655 10/17/11 1435  BP: 122/63 129/50 111/71 145/77    Pulse: 66 62 55 62  Temp: 98.2 F (36.8 C) 97.5 F (36.4 C) 98 F (36.7 C) 97.8 F (36.6 C)  TempSrc: Oral Oral Oral Oral  Resp: 16 16 16 17   Height:      Weight:      SpO2: 96% 98% 98% 96%    Intake/Output Summary (Last 24 hours) at 10/17/11 1452 Last data filed at 10/16/11 2010  Gross per 24 hour  Intake      0 ml  Output   1025 ml  Net  -1025 ml   Filed Weights   10/16/11 1050 10/16/11 2054  Weight: 77.111 kg (170 lb) 68 kg (149 lb 14.6 oz)    Exam:   General:  A+Ox3, NAD  Cardiovascular: rrr  Respiratory: clear ant  Abdomen: +BS, soft, NT/ND  Data Reviewed: Basic Metabolic Panel:  Lab 10/16/11 8119  NA 141  K 4.1  CL 103  CO2 30  GLUCOSE 134*  BUN 25*  CREATININE 1.04  CALCIUM 9.5  MG --  PHOS --   Liver Function Tests:  Lab 10/16/11 1112  AST 20  ALT 21  ALKPHOS 73  BILITOT 0.4  PROT 6.2  ALBUMIN 3.8   No results found for this basename: LIPASE:5,AMYLASE:5 in the last 168 hours No results found for this basename: AMMONIA:5 in the last 168 hours CBC:  Lab 10/16/11 1112  WBC 9.6  NEUTROABS 6.7  HGB 11.3*  HCT 33.8*  MCV 89.7  PLT 228  Cardiac Enzymes: No results found for this basename: CKTOTAL:5,CKMB:5,CKMBINDEX:5,TROPONINI:5 in the last 168 hours BNP (last 3 results) No results found for this basename: PROBNP:3 in the last 8760 hours CBG:  Lab 10/17/11 0647 10/16/11 2237 10/16/11 2008 10/16/11 1542  GLUCAP 83 91 91 109*    No results found for this or any previous visit (from the past 240 hour(s)).   Studies: Dg Chest 2 View  10/16/2011  *RADIOLOGY REPORT*  Clinical Data: Possible chest pain.  Confusion.  CHEST - 2 VIEW  Comparison: None.  Findings: Moderate thoracic spondylosis throughout. Lateral view degraded by patient arm position.  Numerous leads and wires project over the chest.  Remote left clavicular fracture.  Mild cardiomegaly with atherosclerosis in the transverse aorta. No pleural effusion or pneumothorax.   Clear lungs.  IMPRESSION: Mild cardiomegaly without acute disease.   Original Report Authenticated By: Consuello Bossier, M.D.    Ct Head Wo Contrast  10/16/2011  *RADIOLOGY REPORT*  Clinical Data: Episode of increased confusion and right-sided weakness which has since resolved.  Pain.  CT HEAD WITHOUT CONTRAST  Technique:  Contiguous axial images were obtained from the base of the skull through the vertex without contrast.  Comparison: None available.  Findings: Moderate generalized atrophy is present.  Extensive confluent white matter hypoattenuation is evident bilaterally, left greater than right.  There is some loss of gray-white differentiation within the anterior left frontal lobe, best seen on image 26 of series 2. Similar loss of gray-white differentiation and thinning is evident posteriorly, also best seen on image 26. There is some volume loss as well suggesting these are chronic findings.  A remote lacunar infarct is present in the right lentiform nucleus. The basal ganglia are grossly intact otherwise.  Insular cortex is intact bilaterally.  A remote lacunar infarct is evident within the right corona radiata.  There is chronic opacification of the right maxillary sinus. Posterior ethmoid air cells are also opacified on the right.  There is evidence of chronic disease without active disease in the right sphenoid sinus.  The mastoid air cells are clear.  The osseous skull is intact.  Atherosclerotic calcifications are evident in the cavernous carotid arteries bilaterally.  IMPRESSION:  1.  Asymmetric white matter disease and watershed type infarcts of the left frontal and parietal lobes.  These appear to be remote there is angle agent termini without comparison studies. 2.  Remote lacunar infarcts of the basal ganglia and right corona radiata. 3.  Atrophy and extensive white matter disease. 4.  Chronic sinus disease as described. 5.  The watershed type changes on the left suggest the possibility of the  cervical vascular compromise.  If the patient has ongoing symptoms, MRI of the brain as well as MRA of the head and neck may be useful for further evaluation.   Original Report Authenticated By: Jamesetta Orleans. MATTERN, M.D.    Mr Brain Wo Contrast  10/17/2011  *RADIOLOGY REPORT*  Clinical Data:  Hypertension.  Dementia.  History of stroke. Slurred speech and weakness.  Acute but ill-defined vascular disease.  MRI HEAD WITHOUT CONTRAST MRA HEAD WITHOUT CONTRAST  Technique:  Multiplanar, multiecho pulse sequences of the brain and surrounding structures were obtained without intravenous contrast. Angiographic images of the head were obtained using MRA technique without contrast.  Comparison:  Head CT 10/16/2011  MRI HEAD  Findings:  Diffusion imaging does not show any acute or subacute infarction.  There are mild chronic small vessel changes within the pons.  No focal  cerebellar insult.  The cerebral hemispheres show generalized atrophy with chronic small vessel changes within the hemispheric deep white matter and some old lacunar infarctions in the basal ganglia and thalami.  There are old watershed territory ischemic changes in both hemispheres, more extensive on the left than the right.  No sign of mass lesion, acute hemorrhage, hydrocephalus or extra-axial collection.  There are a few foci of hemosiderin deposition related to old infarctions.  No pituitary mass.  There is some mucosal inflammation of the paranasal sinuses, particularly effecting the right maxillary sinus, ethmoid sinuses and right division of the sphenoid sinus.  IMPRESSION: No acute finding.  Extensive chronic ischemic changes throughout the brain as outlined above.  MRA HEAD  Findings: Both internal carotid arteries are patent into the brain. There is atherosclerotic irregularity in the siphon regions but no flow-limiting stenosis.  The anterior and middle cerebral vessels are patent without correctable proximal stenosis, aneurysm or vascular  malformation.  More distal branch vessels show atherosclerotic irregularity diffusely.  Both vertebral arteries are patent to the basilar.  The basilar shows atherosclerotic irregularity but no flow limiting stenosis. Major posterior circulation branch vessels are patent, but show diffuse atherosclerotic irregularity.  IMPRESSION: No major vessel occlusion or correctable proximal stenosis. Diffuse atherosclerotic irregularity of the more distal branch vessels.   Original Report Authenticated By: Thomasenia Sales, M.D.    Mr Mra Head/brain Wo Cm  10/17/2011  *RADIOLOGY REPORT*  Clinical Data:  Hypertension.  Dementia.  History of stroke. Slurred speech and weakness.  Acute but ill-defined vascular disease.  MRI HEAD WITHOUT CONTRAST MRA HEAD WITHOUT CONTRAST  Technique:  Multiplanar, multiecho pulse sequences of the brain and surrounding structures were obtained without intravenous contrast. Angiographic images of the head were obtained using MRA technique without contrast.  Comparison:  Head CT 10/16/2011  MRI HEAD  Findings:  Diffusion imaging does not show any acute or subacute infarction.  There are mild chronic small vessel changes within the pons.  No focal cerebellar insult.  The cerebral hemispheres show generalized atrophy with chronic small vessel changes within the hemispheric deep white matter and some old lacunar infarctions in the basal ganglia and thalami.  There are old watershed territory ischemic changes in both hemispheres, more extensive on the left than the right.  No sign of mass lesion, acute hemorrhage, hydrocephalus or extra-axial collection.  There are a few foci of hemosiderin deposition related to old infarctions.  No pituitary mass.  There is some mucosal inflammation of the paranasal sinuses, particularly effecting the right maxillary sinus, ethmoid sinuses and right division of the sphenoid sinus.  IMPRESSION: No acute finding.  Extensive chronic ischemic changes throughout the brain  as outlined above.  MRA HEAD  Findings: Both internal carotid arteries are patent into the brain. There is atherosclerotic irregularity in the siphon regions but no flow-limiting stenosis.  The anterior and middle cerebral vessels are patent without correctable proximal stenosis, aneurysm or vascular malformation.  More distal branch vessels show atherosclerotic irregularity diffusely.  Both vertebral arteries are patent to the basilar.  The basilar shows atherosclerotic irregularity but no flow limiting stenosis. Major posterior circulation branch vessels are patent, but show diffuse atherosclerotic irregularity.  IMPRESSION: No major vessel occlusion or correctable proximal stenosis. Diffuse atherosclerotic irregularity of the more distal branch vessels.   Original Report Authenticated By: Thomasenia Sales, M.D.     Scheduled Meds:   . sodium chloride   Intravenous STAT  . aspirin EC  81 mg Oral  Daily  . atorvastatin  20 mg Oral QHS  . carvedilol  6.25 mg Oral BID WC  . citalopram  20 mg Oral Daily  . clopidogrel  75 mg Oral Daily  . influenza  inactive virus vaccine  0.5 mL Intramuscular Once  . insulin aspart  0-9 Units Subcutaneous TID WC  . loratadine  10 mg Oral Daily  . LORazepam  1 mg Intravenous Once  . multivitamin with minerals  1 tablet Oral Daily  . pneumococcal 23 valent vaccine  0.5 mL Intramuscular Tomorrow-1000  . trimethoprim-polymyxin b  2 drop Both Eyes Q4H  . DISCONTD: lisinopril  5 mg Oral Daily   Continuous Infusions:   . sodium chloride      Principal Problem:  *TIA (transient ischemic attack) Active Problems:  CVA (cerebral infarction)  Hypertension  Hyperlipidemia  Diabetes mellitus type 2 in nonobese  Seasonal allergies  Cataracts, bilateral  Eye disease  Incontinence of urine  Dementia  Conjunctivitis  Urinary retention  Normocytic anemia    Time spent: 25    [facility], [place]  [facility] Pager [phone] . If 8PM-8AM, please contact  night-coverage at [email or web address], password [facility] 10/17/2011, 2:52 PM  LOS: 1 day

## 2011-10-17 NOTE — Care Management Note (Signed)
    Page 1 of 1   10/18/2011     3:57:53 PM   CARE MANAGEMENT NOTE 10/18/2011  Patient:  Christopher Pineda, Christopher Pineda   Account Number:  [id number]  Date Initiated:  10/17/2011  Documentation initiated by:  Onnie Boer  Subjective/Objective Assessment:   PT WAS ADMITTED WITH AMS     Action/Plan:   PROGRESSION OF CARE AND DISCHARGE PLANNING   Anticipated DC Date:  10/20/2011   Anticipated DC Plan:  HOME W HOME HEALTH SERVICES      DC Planning Services  CM consult      Choice offered to / List presented to:  C-4 Adult Children        HH arranged  HH-1 RN  HH-2 PT  HH-3 OT  HH-5 SPEECH THERAPY      HH agency  [facility]   Status of service:  Completed, signed off Medicare Important Message given?   (If response is "NO", the following Medicare IM given date fields will be blank) Date Medicare IM given:   Date Additional Medicare IM given:    Discharge Disposition:  HOME W HOME HEALTH SERVICES  Per UR Regulation:  Reviewed for med. necessity/level of care/duration of stay  If discussed at Long Length of Stay Meetings, dates discussed:    Comments:  10/17/11 Onnie Boer, RN, BSN 1044 PT WAS ADMITTED WITH WEAKNESS AND AMS.  PTA PT WAS HOME ALONE BUT HAS FAMILY THAT LIVES NEXT DOOR AND IS VERY SUPPORTIVE.  WILL F/U ON DC NEEDS AND RECOMMENDATIONS.

## 2011-10-17 NOTE — Progress Notes (Signed)
  Echocardiogram 2D Echocardiogram has been performed.  Christopher Pineda 10/17/2011, 11:36 AM

## 2011-10-17 NOTE — Progress Notes (Signed)
Patient currently very agitated and "ripped" off his tele leads.  Dr. Eather Colas, orders given, 1 mg ativan given and will continue to monitor

## 2011-10-17 NOTE — Progress Notes (Signed)
VASCULAR LAB PRELIMINARY  PRELIMINARY  PRELIMINARY  PRELIMINARY  Carotid duplex completed.    Preliminary report:  Right - No evidence of significant ICA stenosis. Left - 40% to 59% ICA stenosis in the bulb probably mid range. Bilateral -  Vertebral artery flow is antegrage  Evolette Pendell, RVS 10/17/2011, 12:14 PM

## 2011-10-18 DIAGNOSIS — E785 Hyperlipidemia, unspecified: Secondary | ICD-10-CM

## 2011-10-18 LAB — GLUCOSE, CAPILLARY: Glucose-Capillary: 105 mg/dL — ABNORMAL HIGH (ref 70–99)

## 2011-10-18 MED ORDER — CYANOCOBALAMIN 1000 MCG PO TABS: 1000.0000 ug | ORAL_TABLET | Freq: Every day | ORAL | Status: AC

## 2011-10-18 MED ORDER — HALOPERIDOL LACTATE 5 MG/ML IJ SOLN
INTRAMUSCULAR | Status: AC
  Filled 2011-10-18: qty 1

## 2011-10-18 MED ORDER — HALOPERIDOL LACTATE 5 MG/ML IJ SOLN
5.0000 mg | Freq: Four times a day (QID) | INTRAMUSCULAR | Status: DC | PRN
  Administered 2011-10-18: 5 mg via INTRAVENOUS
  Filled 2011-10-18: qty 1

## 2011-10-18 NOTE — Progress Notes (Signed)
Patient very agitated and combative overnight.  He refused his telemetry and "ripped" his leads off.  He then refused to let us hook him back up.  Patient trying to climb out of bed, using foul language, and scratched the nurse tech.  1 mg ativan and zyprexa given at bedtime.  Sitter was in the room all night.  Passed this information on to the day shift RN who will continue to monitor.

## 2011-10-18 NOTE — Discharge Summary (Addendum)
Physician Discharge Summary  Christopher Pineda ZOX:[id number] DOB: 1926/04/28 DOA: 10/16/2011  PCP: Dorothey Baseman, MD  Admit date: 10/16/2011 Discharge date: 10/18/2011  Recommendations for Outpatient Follow-up:  1. Home health- Pt/OT 2. 24 hour care at home 3. B12 level in 6 weeks  Discharge Diagnoses:  Principal Problem:  *TIA (transient ischemic attack) Active Problems:  CVA (cerebral infarction)  Hypertension  Hyperlipidemia  Diabetes mellitus type 2 in nonobese  Seasonal allergies  Cataracts, bilateral  Eye disease  Incontinence of urine  Dementia  Conjunctivitis  Urinary retention  Normocytic anemia   Discharge Condition:  Home with 24 hour care  Diet recommendation: cardiac/diabetic diet  Filed Weights   10/16/11 1050 10/16/11 2054  Weight: 77.111 kg (170 lb) 68 kg (149 lb 14.6 oz)    History of present illness:  The patient is an 78 year old male with HTN, HLD, T2DM, dementia, and history of CVA who presents with slurred speech, and generalized weakness. Last night the patient was at his baseline of A&Ox2. Per family, he normally has to be woken up in the morning, but this morning, when they came over, he was sitting out on the porch and his speech was very difficult to understand. Daughter handed him coffee, but he could not hold his coffee properly - it kept tipping. His son came over and by that time his speech was even worse so they called 911. When EMS arrived, his speech was very slurred and he was unable to hold up both arms. No facial droop. His gait was lurching and he seemed like he was going to fall. He needed assistance getting his shoes on. EMS stated that his blood pressure was normal and he started to get better even while they were getting him in the ambulance.  When he arrived in the ER, he was laughing and talking and speaking normally and he has been fine okay. He had a head CT which showed a multitude of infarcts including watershed infarcts which  appeared remote. His electrolytes were notable for an elevated BUN and mild normocytic anemia. His urinalysis was unremarkable, but he was retaining of urine and had a foley catheter placed. EKG was NSR.    Hospital Course:  TIA: Patient has history of CVA and multiple risk factors for stroke. Admit to rule out stroke and optimize risk factors. No evidence of UTI on UA and patient was not orthostatic.  - MRI/MRA head: No acute finding. Extensive chronic ischemic changes throughout  the brain.  - Carotid dupex showed: Left - 40% to 59% ICA stenosis in the bulb probably mid range  - ECHO - ok - Continue ASA 81mg  and plavix 75 mg  - Lipid panel: LDL< 80  - A1c: 6.1  - home health  Dementia:  - TSH ok , B12 on lower end of normal, RPR negative   Conjunctivitis: Purulent and likely bacterial superinfection caused by chronic eye irritation from eyelashes  - Culture  - Polytrim  - Needs close outpatient ophthalmology follow up.   Urinary retention:  -resolved   HTN: Elevated blood pressures, then low. Labile.  - ACE - Continue beta blocker to prevent rebound tachycardia  - Hydralazine prn   HLD:  - Continue statin   DM: Patient compliant with medications but noncompliant with diet. On ACEI.    Procedures: Echo: Study Conclusions  - Left ventricle: The cavity size was normal. Wall thickness was normal. Systolic function was normal. The estimated ejection fraction was in the range of  60% to 65%. - Aortic valve: There was mild stenosis. Trivial regurgitation. Valve area: 1.69cm^2(VTI). Valve area: 1.47cm^2 (Vmax). - Left atrium: The atrium was mildly dilated  Carotid duplex Right - No evidence of significant ICA stenosis. - Left - 49% to 59% ICA stenosis mid range. - Bilateral - Vertebral artery flow is antegrade.     Discharge Exam: Filed Vitals:   10/17/11 1533 10/17/11 1932 10/18/11 0222 10/18/11 1002  BP: 118/69 116/104 138/88 168/86  Pulse: 60 71 56 66    Temp: 97.4 F (36.3 C) 97.3 F (36.3 C) 97.3 F (36.3 C) 97.3 F (36.3 C)  TempSrc: Oral Oral Axillary Axillary  Resp: 20 20 18 20   Height:      Weight:      SpO2: 100% 95% 98% 99%    General: sleepy, no longer aggitated Cardiovascular: rrr Respiratory: clear anterior Skin: few areas of bruising  Discharge Instructions      Discharge Orders    Future Orders Please Complete By Expires   Diet - low sodium heart healthy      Diet Carb Modified      Increase activity slowly      Discharge instructions      Comments:   With 24 hour care Home health Follow up with opthalmology for eye discharge       Medication List     As of 10/18/2011  1:59 PM    TAKE these medications         aspirin EC 81 MG tablet   Take 81 mg by mouth daily.      atorvastatin 20 MG tablet   Commonly known as: LIPITOR   Take 20 mg by mouth at bedtime.      carvedilol 6.25 MG tablet   Commonly known as: COREG   Take 6.25 mg by mouth 2 (two) times daily with a meal.      cetirizine 10 MG tablet   Commonly known as: ZYRTEC   Take 10 mg by mouth daily.      citalopram 20 MG tablet   Commonly known as: CELEXA   Take 20 mg by mouth daily.      clopidogrel 75 MG tablet   Commonly known as: PLAVIX   Take 75 mg by mouth daily.      cyanocobalamin 1000 MCG tablet   Take 1 tablet (1,000 mcg total) by mouth daily.      metFORMIN 1000 MG tablet   Commonly known as: GLUCOPHAGE   Take 1,000 mg by mouth 2 (two) times daily with a meal.      multivitamin with minerals Tabs   Take 1 tablet by mouth daily.      quinapril 10 MG tablet   Commonly known as: ACCUPRIL   Take 10 mg by mouth at bedtime.         Follow-up Information    Follow up with Dorothey Baseman, MD. (PCP)    Contact information:   [address] [place] [address] [phone]           The results of significant diagnostics from this hospitalization (including imaging, microbiology, ancillary and  laboratory) are listed below for reference.    Significant Diagnostic Studies: Dg Chest 2 View  10/16/2011  *RADIOLOGY REPORT*  Clinical Data: Possible chest pain.  Confusion.  CHEST - 2 VIEW  Comparison: None.  Findings: Moderate thoracic spondylosis throughout. Lateral view degraded by patient arm position.  Numerous leads and wires project over the chest.  Remote left clavicular fracture.  Mild cardiomegaly with atherosclerosis in the transverse aorta. No pleural effusion or pneumothorax.  Clear lungs.  IMPRESSION: Mild cardiomegaly without acute disease.   Original Report Authenticated By: Consuello Bossier, M.D.    Ct Head Wo Contrast  10/16/2011  *RADIOLOGY REPORT*  Clinical Data: Episode of increased confusion and right-sided weakness which has since resolved.  Pain.  CT HEAD WITHOUT CONTRAST  Technique:  Contiguous axial images were obtained from the base of the skull through the vertex without contrast.  Comparison: None available.  Findings: Moderate generalized atrophy is present.  Extensive confluent white matter hypoattenuation is evident bilaterally, left greater than right.  There is some loss of gray-white differentiation within the anterior left frontal lobe, best seen on image 26 of series 2. Similar loss of gray-white differentiation and thinning is evident posteriorly, also best seen on image 26. There is some volume loss as well suggesting these are chronic findings.  A remote lacunar infarct is present in the right lentiform nucleus. The basal ganglia are grossly intact otherwise.  Insular cortex is intact bilaterally.  A remote lacunar infarct is evident within the right corona radiata.  There is chronic opacification of the right maxillary sinus. Posterior ethmoid air cells are also opacified on the right.  There is evidence of chronic disease without active disease in the right sphenoid sinus.  The mastoid air cells are clear.  The osseous skull is intact.  Atherosclerotic calcifications  are evident in the cavernous carotid arteries bilaterally.  IMPRESSION:  1.  Asymmetric white matter disease and watershed type infarcts of the left frontal and parietal lobes.  These appear to be remote there is angle agent termini without comparison studies. 2.  Remote lacunar infarcts of the basal ganglia and right corona radiata. 3.  Atrophy and extensive white matter disease. 4.  Chronic sinus disease as described. 5.  The watershed type changes on the left suggest the possibility of the cervical vascular compromise.  If the patient has ongoing symptoms, MRI of the brain as well as MRA of the head and neck may be useful for further evaluation.   Original Report Authenticated By: Jamesetta Orleans. MATTERN, M.D.    Mr Brain Wo Contrast  10/17/2011  *RADIOLOGY REPORT*  Clinical Data:  Hypertension.  Dementia.  History of stroke. Slurred speech and weakness.  Acute but ill-defined vascular disease.  MRI HEAD WITHOUT CONTRAST MRA HEAD WITHOUT CONTRAST  Technique:  Multiplanar, multiecho pulse sequences of the brain and surrounding structures were obtained without intravenous contrast. Angiographic images of the head were obtained using MRA technique without contrast.  Comparison:  Head CT 10/16/2011  MRI HEAD  Findings:  Diffusion imaging does not show any acute or subacute infarction.  There are mild chronic small vessel changes within the pons.  No focal cerebellar insult.  The cerebral hemispheres show generalized atrophy with chronic small vessel changes within the hemispheric deep white matter and some old lacunar infarctions in the basal ganglia and thalami.  There are old watershed territory ischemic changes in both hemispheres, more extensive on the left than the right.  No sign of mass lesion, acute hemorrhage, hydrocephalus or extra-axial collection.  There are a few foci of hemosiderin deposition related to old infarctions.  No pituitary mass.  There is some mucosal inflammation of the paranasal sinuses,  particularly effecting the right maxillary sinus, ethmoid sinuses and right division of the sphenoid sinus.  IMPRESSION: No acute finding.  Extensive chronic ischemic changes throughout  the brain as outlined above.  MRA HEAD  Findings: Both internal carotid arteries are patent into the brain. There is atherosclerotic irregularity in the siphon regions but no flow-limiting stenosis.  The anterior and middle cerebral vessels are patent without correctable proximal stenosis, aneurysm or vascular malformation.  More distal branch vessels show atherosclerotic irregularity diffusely.  Both vertebral arteries are patent to the basilar.  The basilar shows atherosclerotic irregularity but no flow limiting stenosis. Major posterior circulation branch vessels are patent, but show diffuse atherosclerotic irregularity.  IMPRESSION: No major vessel occlusion or correctable proximal stenosis. Diffuse atherosclerotic irregularity of the more distal branch vessels.   Original Report Authenticated By: Thomasenia Sales, M.D.    Mr Mra Head/brain Wo Cm  10/17/2011  *RADIOLOGY REPORT*  Clinical Data:  Hypertension.  Dementia.  History of stroke. Slurred speech and weakness.  Acute but ill-defined vascular disease.  MRI HEAD WITHOUT CONTRAST MRA HEAD WITHOUT CONTRAST  Technique:  Multiplanar, multiecho pulse sequences of the brain and surrounding structures were obtained without intravenous contrast. Angiographic images of the head were obtained using MRA technique without contrast.  Comparison:  Head CT 10/16/2011  MRI HEAD  Findings:  Diffusion imaging does not show any acute or subacute infarction.  There are mild chronic small vessel changes within the pons.  No focal cerebellar insult.  The cerebral hemispheres show generalized atrophy with chronic small vessel changes within the hemispheric deep white matter and some old lacunar infarctions in the basal ganglia and thalami.  There are old watershed territory ischemic changes in  both hemispheres, more extensive on the left than the right.  No sign of mass lesion, acute hemorrhage, hydrocephalus or extra-axial collection.  There are a few foci of hemosiderin deposition related to old infarctions.  No pituitary mass.  There is some mucosal inflammation of the paranasal sinuses, particularly effecting the right maxillary sinus, ethmoid sinuses and right division of the sphenoid sinus.  IMPRESSION: No acute finding.  Extensive chronic ischemic changes throughout the brain as outlined above.  MRA HEAD  Findings: Both internal carotid arteries are patent into the brain. There is atherosclerotic irregularity in the siphon regions but no flow-limiting stenosis.  The anterior and middle cerebral vessels are patent without correctable proximal stenosis, aneurysm or vascular malformation.  More distal branch vessels show atherosclerotic irregularity diffusely.  Both vertebral arteries are patent to the basilar.  The basilar shows atherosclerotic irregularity but no flow limiting stenosis. Major posterior circulation branch vessels are patent, but show diffuse atherosclerotic irregularity.  IMPRESSION: No major vessel occlusion or correctable proximal stenosis. Diffuse atherosclerotic irregularity of the more distal branch vessels.   Original Report Authenticated By: Thomasenia Sales, M.D.     Microbiology: Recent Results (from the past 240 hour(s))  URINE CULTURE     Status: Normal   Collection Time   10/16/11 12:58 PM      Component Value Range Status Comment   Specimen Description URINE, CATHETERIZED   Final    Special Requests NONE   Final    Culture  Setup Time 10/16/2011 21:42   Final    Colony Count NO GROWTH   Final    Culture NO GROWTH   Final    Report Status 10/17/2011 FINAL   Final   EYE CULTURE     Status: Normal (Preliminary result)   Collection Time   10/17/11 12:18 AM      Component Value Range Status Comment   Specimen Description EYE LEFT   Final  Special Requests  NONE   Final    Culture NO GROWTH 1 DAY   Final    Report Status PENDING   Incomplete   EYE CULTURE     Status: Normal (Preliminary result)   Collection Time   10/17/11 12:18 AM      Component Value Range Status Comment   Specimen Description EYE RIGHT   Final    Special Requests NONE   Final    Culture NO GROWTH 1 DAY   Final    Report Status PENDING   Incomplete      Labs: Basic Metabolic Panel:  Lab 10/16/11 1610  NA 141  K 4.1  CL 103  CO2 30  GLUCOSE 134*  BUN 25*  CREATININE 1.04  CALCIUM 9.5  MG --  PHOS --   Liver Function Tests:  Lab 10/16/11 1112  AST 20  ALT 21  ALKPHOS 73  BILITOT 0.4  PROT 6.2  ALBUMIN 3.8   No results found for this basename: LIPASE:5,AMYLASE:5 in the last 168 hours No results found for this basename: AMMONIA:5 in the last 168 hours CBC:  Lab 10/16/11 1112  WBC 9.6  NEUTROABS 6.7  HGB 11.3*  HCT 33.8*  MCV 89.7  PLT 228   Cardiac Enzymes: No results found for this basename: CKTOTAL:5,CKMB:5,CKMBINDEX:5,TROPONINI:5 in the last 168 hours BNP: BNP (last 3 results) No results found for this basename: PROBNP:3 in the last 8760 hours CBG:  Lab 10/18/11 1142 10/18/11 0702 10/17/11 1655 10/17/11 0647 10/16/11 2237  GLUCAP 105* 102* 172* 83 91    Time coordinating discharge: 35 minutes  Signed:  Benjamine Mola, Tanyon Alipio  [facility] 10/18/2011, 1:59 PM

## 2011-10-18 NOTE — Progress Notes (Signed)
Pt being quiet and cooperative, took his all medication except the pneu vaccination. Pt had a good appetite for lunch. Contacted the case manager and home health is set up. Health education is given to family. No other concerns.

## 2011-10-18 NOTE — Progress Notes (Signed)
PT/OT Cancellation Note  Treatment cancelled today due to medical issues with patient which prohibited therapy. I was requested by patients nurse not to awake patient at this time due to him finally being settled down from earlier events and agitation. Will reattempt as able based on patient agitation state and ability to work with therapies.  Fredrich Birks 10/18/2011, 9:59 AM 10/18/2011 Fredrich Birks PTA [phone] pager [phone] office

## 2011-10-18 NOTE — Progress Notes (Signed)
SLP Cancellation Note  Treatment cancelled today due to pt medicated with Haldol earlier, quiet/sleeping at this time.  Discharge is pending this afternoon.  Spoke with RN and Case Management.  Recommend adding home health ST for evaluation after DC. Pt has cognitive deficits at baseline, and has been given haldol.  Test results at this time would likely not be highly reliable.    Thank you for this referral. Ellean Firman B. [place], [facility], CCC-SLP [phone]   Leigh Aurora 10/18/2011, 3:19 PM

## 2011-10-19 LAB — EYE CULTURE: Culture: NO GROWTH

## 2012-02-12 ENCOUNTER — Inpatient Hospital Stay: Payer: Self-pay | Admitting: Internal Medicine

## 2012-02-12 LAB — CBC WITH DIFFERENTIAL/PLATELET
Eosinophil %: 1.5 %
HCT: 40 % (ref 40.0–52.0)
Lymphocyte #: 3.1 10*3/uL (ref 1.0–3.6)
Monocyte #: 0.9 x10 3/mm (ref 0.2–1.0)
Monocyte %: 4.4 %
Platelet: 254 10*3/uL (ref 150–440)
RBC: 4.46 10*6/uL (ref 4.40–5.90)
RDW: 13.4 % (ref 11.5–14.5)
WBC: 21.2 10*3/uL — ABNORMAL HIGH (ref 3.8–10.6)

## 2012-02-12 LAB — URINALYSIS, COMPLETE
Bilirubin,UR: NEGATIVE
Glucose,UR: >=500 mg/dL (ref 0–75)
Leukocyte Esterase: NEGATIVE
Ph: 5 (ref 4.5–8.0)
Protein: >=500
Specific Gravity: 1.015 (ref 1.003–1.030)

## 2012-02-12 LAB — COMPREHENSIVE METABOLIC PANEL
Albumin: 4.3 g/dL (ref 3.4–5.0)
Anion Gap: 10 (ref 7–16)
Co2: 25 mmol/L (ref 21–32)
Creatinine: 1.04 mg/dL (ref 0.60–1.30)
Osmolality: 289 (ref 275–301)
Sodium: 139 mmol/L (ref 136–145)

## 2012-02-12 LAB — APTT: Activated PTT: 26.5 secs (ref 23.6–35.9)

## 2012-02-12 LAB — PROTIME-INR: Prothrombin Time: 13.5 secs (ref 11.5–14.7)

## 2012-02-12 LAB — CK TOTAL AND CKMB (NOT AT ARMC)
CK, Total: 2695 U/L — ABNORMAL HIGH (ref 35–232)
CK-MB: 2.5 ng/mL (ref 0.5–3.6)

## 2012-02-13 LAB — BASIC METABOLIC PANEL
Anion Gap: 9 (ref 7–16)
Calcium, Total: 8.5 mg/dL (ref 8.5–10.1)
Calcium, Total: 8.4 mg/dL — ABNORMAL LOW (ref 8.5–10.1)
Chloride: 108 mmol/L — ABNORMAL HIGH (ref 98–107)
Chloride: 109 mmol/L — ABNORMAL HIGH (ref 98–107)
Co2: 26 mmol/L (ref 21–32)
Creatinine: 0.98 mg/dL (ref 0.60–1.30)
Creatinine: 1.06 mg/dL (ref 0.60–1.30)
EGFR (African American): >60
EGFR (Non-African Amer.): >60
Glucose: 102 mg/dL — ABNORMAL HIGH (ref 65–99)
Osmolality: 286 (ref 275–301)
Potassium: 3.7 mmol/L (ref 3.5–5.1)
Potassium: 3.8 mmol/L (ref 3.5–5.1)
Sodium: 142 mmol/L (ref 136–145)

## 2012-02-13 LAB — CK TOTAL AND CKMB (NOT AT ARMC)
CK, Total: 2588 U/L — ABNORMAL HIGH (ref 35–232)
CK-MB: 5.9 ng/mL — ABNORMAL HIGH (ref 0.5–3.6)

## 2012-02-13 LAB — CBC WITH DIFFERENTIAL/PLATELET
Basophil #: 0 10*3/uL (ref 0.0–0.1)
Basophil %: 0.3 %
Eosinophil #: 0.1 10*3/uL (ref 0.0–0.7)
HGB: 10.6 g/dL — ABNORMAL LOW (ref 13.0–18.0)
MCHC: 34.6 g/dL (ref 32.0–36.0)
MCV: 89 fL (ref 80–100)
Monocyte #: 0.8 x10 3/mm (ref 0.2–1.0)
Neutrophil #: 6.2 10*3/uL (ref 1.4–6.5)

## 2012-02-13 LAB — MAGNESIUM
Magnesium: 1.9 mg/dL
Magnesium: 1.8 mg/dL

## 2012-02-13 LAB — TROPONIN I: Troponin-I: 1.28 ng/mL — ABNORMAL HIGH

## 2012-02-14 LAB — BASIC METABOLIC PANEL
Anion Gap: 7 (ref 7–16)
BUN: 19 mg/dL — ABNORMAL HIGH (ref 7–18)
Calcium, Total: 8.6 mg/dL (ref 8.5–10.1)
Creatinine: 1.1 mg/dL (ref 0.60–1.30)
EGFR (Non-African Amer.): >60
Glucose: 129 mg/dL — ABNORMAL HIGH (ref 65–99)
Osmolality: 295 (ref 275–301)

## 2012-02-14 LAB — TROPONIN I: Troponin-I: 0.35 ng/mL — ABNORMAL HIGH

## 2012-02-18 LAB — CULTURE, BLOOD (SINGLE)

## 2012-07-16 NOTE — Progress Notes (Signed)
10/18/11 Addendum, late entry Martie Round, OTR/L Pager: [phone]

## 2014-05-07 NOTE — Consult Note (Signed)
PATIENT NAME:  Christopher Pineda, Christopher Pineda MR#:  [id number] DATE OF BIRTH:  [birth year]  DATE OF CONSULTATION:  02/13/2012  REFERRING PHYSICIAN:   CONSULTING PHYSICIAN:  Marcina MillardAlexander Khara Renaud, MD  PRIMARY CARE PHYSICIAN: Dorothey Basemanavid Bronstein, MD  CHIEF COMPLAINT: "I passed out."   REASON FOR CONSULTATION: Evaluation of elevated cardiac biomarkers.   HISTORY OF PRESENT ILLNESS: The patient is an 81 year old gentleman referred for evaluation of elevated troponin. The patient was in his usual state of health until date of admission, 02/12/2012. The patient typically lives at home alone but his son and daughter typically stays with him in the evenings due to dementia. On the morning of 02/12/2012, the patient inadvertently went outside to take the dog for a walk and was wearing shorts and a T-shirt and was found unconscious. Upon arrival by EMS, temperature was 89.4. The [facility] Room where he received intravenous fluids and warm blankets. The patient denies chest pain. He was admitted to telemetry. The patient apparently by report was outside for approximately 30 minutes. The patient did have elevated CPK and MB of 2,695 with MB fraction of 7.7 and a troponin of 1.2.   PAST MEDICAL HISTORY: 1. 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  2. Hypertension.  3. Diabetes.  4. Hyperlipidemia.   MEDICATIONS: 1. Aspirin 81 mg q. day. 2. Amlodipine 5 mg q. day. 3. Atorvastatin 20 mg q. day. 4. Carvedilol 12.5 mg q. day. 5. Celexa 20 mg q. day. 6. Plavix 75 mg q. day. 7. Metformin 1 gram 2 times a day. 8. Multi-Vite 1 q. day. 9. Quinapril 20 mg 2 times a day.   SOCIAL HISTORY: The patient currently lives alone, but lives near his son and daughter who typically care for him, stay with him at night. He has a remote tobacco abuse history.   FAMILY HISTORY: No immediate family history of coronary artery disease or myocardial infarction.   REVIEW OF  SYSTEMS: CONSTITUTIONAL: No fever or chills.  EYES: No blurry vision.  EARS: No hearing loss.  RESPIRATORY: No shortness of breath.  CARDIOVASCULAR: No chest pain.  GASTROINTESTINAL: No nausea, vomiting or diarrhea.  GENITOURINARY: No dysuria or hematuria.  ENDOCRINE: No polyuria or polydipsia.  INTEGUMENTARY: No rash.  MUSCULOSKELETAL: No arthralgias or myalgias.  NEUROLOGICAL: The patient does have a history of prior TIAs and has baseline dementia.  PSYCHIATRIC: No depression or anxiety.   PHYSICAL EXAMINATION: VITAL SIGNS: Blood pressure 122/65, pulse 64, respirations 20, temperature 98.7 and pulse oximetry 95%.  HEENT: Pupils are equal and reactive to light and accommodation.  NECK: Supple without thyromegaly.  LUNGS: Clear.  HEART: Normal JVP.  Normal PMI. Regular rate and rhythm. Normal S1, S2. Grade I to II over VI systolic murmur.  ABDOMEN: Soft and nontender. EXTREMITIES: Pulses were intact bilaterally.  MUSCULOSKELETAL: Normal muscle tone.  NEUROLOGIC: The patient was alert and oriented x 1. Motor and sensory are both grossly intact.   IMPRESSION: This is an 81 year old gentleman with dementia who was admitted with hypothermia. The patient did not experience any chest pain and there were no ECG changes. The patient had borderline elevated troponin. The patient had markedly elevated CPK and MB with a MB fraction which represented less than 0.5% of the total consistent with demand/supply ischemia in the setting of hypothermia and very unlikely due to acute coronary syndrome.   RECOMMENDATIONS: 1. Agree with overall current therapy.  2. Would defer full dose anticoagulation.  3. Would continue current antihypertensive  medications.  4. Would defer further cardiac diagnostics at this time.  ____________________________ Marcina Millard, MD ap:sb D: 02/13/2012 09:53:34 ET T: 02/13/2012 10:17:07 ET JOB#: [id number]  cc: Marcina Millard, MD, <Dictator> Marcina Millard  MD ELECTRONICALLY SIGNED 03/11/2012 14:57

## 2014-05-07 NOTE — Consult Note (Signed)
Brief Consult Note: Diagnosis: Borderline elevated troponin, likely demand/supply ischemia due to hypothermia.   Patient was seen by consultant.   Consult note dictated.   Comments: REC  Agree with current therapy, defer full dose anticoagulation, defer further cardiac diagnostics, continue home hyptensive meds.  Electronic Signatures: Marcina MillardParaschos, Delmore Sear (MD)  (Signed 29-Jan-14 09:55)  Authored: Brief Consult Note   Last Updated: 29-Jan-14 09:55 by Marcina MillardParaschos, Luther Newhouse (MD)

## 2014-05-07 NOTE — H&P (Signed)
PATIENT NAME:  Christopher EmeraldHARRELSON, Christopher MR#:  [id number] DATE OF BIRTH:  1926/05/01  DATE OF ADMISSION:  02/12/2012  PRIMARY CARE PHYSICIAN:  [name], but the family says it is [name].   EMERGENCY ROOM PHYSICIAN:  [name].   CHIEF COMPLAINT:   Hypothermia.   HISTORY OF PRESENT ILLNESS: An 81 year old male patient with history of hypertension, diabetes, hyperlipidemia, many strokes, found by EMS outside of his home unresponsive. The  neighbors called 911. The patient was taken care of by the family, and the patient's son and daughter usually take care of him, and he is usually taken care of by them all the time. This morning, son went to work and daughter had to come in around 30 minutes after. Meanwhile, the patient went out and took the dog for a walk. The patient fell due to balance problems and was outside for about 30 minutes. The patient was in his shorts and also short sleeve shirt, and the patient was unconscious, and the EMS arrived and temperature was 89.4. The patient's daughter came in the ambulance with the patient.  Also, at this time, he was unresponsive, and also had shivering.  The patient received 1 liter of IV fluids in the Emergency Room, and had warm blankets placed. So repeat temperature went up to 94. The patient became more alert, awake and responsive. Denies any complaints. No chest pain. No headache. No body pains. Said that he just lost balance and fell on the porch.   PAST MEDICAL HISTORY:  Significant for hypertension, diabetes, hyperlipidemia and mini strokes and back pain.   The patient also has issues with balance, and this morning he forgot to take his cane.  ALLERGIES:  No known allergies.   SOCIAL HISTORY:  No smoking. No drinking. The patient with a son, and  daughter also takes care of him sometimes in the days.    FAMILY HISTORY:  Significant for hypertension.   MEDICATIONS:  The patient is on amlodipine 5 mg daily, aspirin 81 mg daily, atorvastatin 20 mg  daily, Coreg 12.5 mg p.o. b.i.d., Celexa 20 mg daily, Plavix 75 mg daily, metformin 1 gram p.o. b.i.d., multivitamin 1 tablet daily, quinapril 20 mg p.o. b.i.d., vitamin B12 1 gram p.o. daily.    REVIEW OF SYSTEMS:    CONSTITUTIONAL:  Patient denies any fever. No weakness.  EYES:  No blurred vision.  EAR, NOSE, THROAT:  No tinnitus. No epistaxis. No difficulty swallowing.  RESPIRATORY:  Denies any cough or wheezing. No trouble breathing.  CARDIOVASCULAR:  No chest pain. No orthopnea. No palpitations.  GASTROINTESTINAL:  No nausea, no vomiting. No abdominal pain.  GENITOURINARY:  No dysuria.  ENDOCRINE:  No polydipsia or nocturia. The patient has a history of diabetes.  INTEGUMENTARY:  No skin rashes.  MUSCULOSKELETAL:  Denies any joint pains.  NEUROLOGIC:  No numbness. The patient had history of TIA, but he uses a cane and has balance issues.  PSYCHIATRIC:  No insomnia.   PHYSICAL EXAMINATION: VITALS:  Patient's temperature initially was 89.4, repeat 94. Heart rate 74, during my visit, it was around 59. Blood pressure 180/104, sats 98% on room air.  GENERAL:  Alert, awake, oriented, answering questions appropriately.  HEAD, EYE:  Head atraumatic, normocephalic. Pupils equally reacting to light. Extraocular movements intact.  EAR, NOSE, THROAT:  No tympanic membrane congestion. No turbinate hypertrophy. No oropharyngeal erythema.  NECK:  Normal range of motion. No JVD. No thyroid enlargement.  LUNGS:  Clear to auscultation. No wheeze. No rales.  CARDIOVASCULAR:  S1, S2 regular. PMI not displaced. Peripheral pulses are intact.  ABDOMEN:  Soft, nontender, nondistended. Bowel sounds present.  EXTREMITIES:  No cyanosis. No clubbing. No edema.  NEUROLOGIC:  He is alert, awake, oriented. Cranial nerves II through XII intact. Power 5/5 in upper and lower extremities. Sensation is intact. DTRs are 2+ bilaterally.  PSYCHIATRIC:  Mood and affect are within normal limits.   LABORATORY, DIAGNOSTIC  AND RADIOLOGICAL DATA: Troponin 0.12. INR is 1. Magnesium 1.3. Electrolytes: Sodium 139, potassium 5.1, chloride 104, bicarb 25, BUN 23, creatinine 1.04, glucose 235. WBC is 21.2, hemoglobin 13.2, hematocrit 40, platelets 254. The patient's CK is 95, CPK-MB 2.5. CT head showed fairly extensive diffuse cerebral and cerebellar atrophy and also small vessel disease. No evidence of acute skull fracture. No acute stroke. CT of the cervical spine shows no evidence of cervical spine fracture or dislocation. Some degenerative disease. Pelvic x-ray shows no acute bony abnormality. Initial EKG had a lot of artifacts.  They are going to repeat another EKG. The patient, right now, is on the monitor, looks like normal sinus rhythm at 60 beats per minute, and I did not see any ST-T changes.   ASSESSMENT AND PLAN: 1.  The patient is an 81 year old male patient who was lying outside for 30 minutes, found to be unresponsive with hypothermia.  The patient has altered mental status with metabolic encephalopathy due to moderate hypothermia. The patient already actively rewarmed with warming blankets and IV fluids, and repeat temperature already is up at 94, so we are going to admit him to telemetry.  The patient's airway breathing and circulation are normal with normal blood pressure, so we are going to check the vitals q.2 hours, admit to telemetry, and also continue IV fluids at normal saline 60 mL/hour, watch for signs of hypotension during activie warming and also watch for arrhythmias, hyperkalemia and also rhabdomyolysis. The patient already has potassium a little bit up, so they are going to monitored electrolytes again in the evening. The patient has elevated troponin.  2.  Elevated troponins. No chest pain. Likely from hypothermia. Monitor 2 more sets of troponins and EKG. Continue aspirin and also small dose beta blockers, along with Plavix and statins, and check for cardiac arrhythmia on the telemetry.  3.  Diabetes  mellitus type 2.  The patient will be on sliding scale with coverage for today and hold ACE inhibitor and metformin to avoid renal failure.   4.  Elevated white count, likely due to stress. Check the CBC in the morning. No focus of infection, but he is persistently hypothermic.  We need to check to see if he has any occult infection, but at this time, I do not think he has any evidence of sepsis, so we will check CBC in the morning.  5.  . The patient has history of mini transient ischemic attacks before.  We will get the physical therapy consult,  Plavix at this time.   Time spent on history and physical is about 50 minutes.    ____________________________ Katha Hamming, MD sk:dm D: 02/12/2012 12:15:32 ET T: 02/12/2012 12:47:00 ET JOB#: [id number]  cc: Katha Hamming, MD, <Dictator> Katha Hamming MD ELECTRONICALLY SIGNED 03/11/2012 9:54

## 2014-05-07 NOTE — Discharge Summary (Signed)
PATIENT NAME:  Christopher Pineda, Christopher Pineda MR#:  [id number] DATE OF BIRTH:  24-Jan-1926  DATE OF ADMISSION:  02/12/2012 DATE OF DISCHARGE:  02/14/2012  PRIMARY CARE PHYSICIAN: [name].   FINAL DIAGNOSES:  1. Encephalopathy with underlying dementia.  2. Hypothermia.  3. Rhabdomyolysis.  4. Elevated troponin.  5. Diabetes.  6. Hyperlipidemia.  7. A history of transient ischemic attack.  8. Hypertension.  9. Leukocytosis.   MEDICATIONS ON DISCHARGE INCLUDE: Metformin 1000 mg twice a day, Plavix 75 mg daily, Celexa 20 mg daily, aspirin 81 mg daily, Carvedilol 12.5 mg twice a day, multivitamin 1 tablet daily, amlodipine 5 mg daily, vitamin B12 1000 mcg orally daily, quinapril 20 mg twice a day. Stop atorvastatin.   DIET: Low sodium diet, 1800 ADA diet, regular consistency.   ACTIVITY: Activity as tolerated.    REFERRAL: Home health. Physical therapy nurse and nurse aide.  FOLLOW-UP: [name] in one week.   REASON FOR ADMISSION: The patient was admitted 02/12/2012 and discharged 02/14/2012, came in with hypokalemia.   HISTORY OF PRESENT ILLNESS: This is an 81 year old man with diabetes, hypertension, hyperlipidemia, TIA and dementia. He was found outside of his house unresponsive, neighbor saw a fall and called 911. He is taking care of by the family 24/7 and there was a period of time during a change in shift where he just went outside. He was outside for about 30 minutes in a short sleeve shirt and Depends, had a fall and was found unconscious and a temperature of 89.4. He was given warming blankets for the hypothermia. Once his temperature did come up, he became more responsive. He was found to have elevated troponin and elevated CPK consistent with rhabdomyolysis. He was given IV fluid hydration.  LABORATORY, DIAGNOSTIC, AND RADIOLOGICAL DATA DURING THE HOSPITAL COURSE INCLUDED: An initial EKG that was read as atrial fibrillation, but I think there is a lot of interference here and movement  so I cannot call it atrial fibrillation. Troponin was 0.12, INR 1.0. Magnesium 1.3, glucose 235, BUN 23, creatinine 1.04, sodium 139, potassium 5.1, chloride 104, CO2 25, calcium 9.1. Liver function tests normal range. White blood cell count 21.2, hemoglobin and hematocrit 13.2 and 40.4, platelet count of 254. Blood culture negative.   CT scan of the head without contrast showed no acute intracranial hemorrhage, diffuse cerebral and cerebellar atrophy with compensatory ventriculomegaly and chronic small vessel ischemia.   CT cervical spine: No evidence of fracture or dislocation. Moderate degenerative disk change.   Pelvis x-ray: Negative.   Chest x-ray: Negative.   Urine culture: Negative. Urinalysis of greater than 500 mg/dL of protein, greater than 500 mg/dL of glucose.   A repeat EKG looks like sinus arrhythmia. Troponin went up to 1.2. CPK 2695, MB 7.7. Next troponin 1.28, next CPK 2588. Repeat magnesium 1.9. TSH 1.97. Repeat white blood cell count 9.3. Troponin upon discharge 0.35. CPK upon discharge 1327. Creatinine upon discharge 1.1. Glucose 129.   HOSPITAL COURSE PER PROBLEM LIST:  1. The patient's encephalopathy. The patient does have an underlying dementia but with the low temperature, he was unresponsive. He became back to his baseline mental status once he was warmed up.  2. Hypothermia. I believe this is secondary to being outside in a Depends and sleeve shirt while walking the dogs for 30 minutes. This had improved to normal range upon discharge.  3. Rhabdomyolysis. The patient was given IV fluids. The atorvastatin was stopped. CPK came down into the 1000 range. Because of the patient's dementia  and agitation, I believe it is better to get him out of the hospital and hold off on his atorvastatin at this point. Encourage the family to give him fluids.  4. Elevated troponin. This is secondary to the rhabdomyolysis. This is not a myocardial infarction. I repeat this is not a myocardial  infarction. The patient was seen in consultation by Dr. Darrold Junker, no further cardiac work-up.  5. Diabetes. He is on metformin.  6. Hyperlipidemia. Statin had to be held secondary to rhabdomyolysis.  7. History of TIA. On aspirin and Plavix.  8. Hypertension. Blood pressure controlled on Coreg and quinapril.  9. Leukocytosis. This is a stress reaction from being down, unresponsive. A repeat white count is normal. Cultures are negative. No signs of infection.   TIME SPENT ON DISCHARGE: 35 minutes.   ____________________________ Herschell Dimes. Renae Gloss, MD rjw:jm D: 02/15/2012 06:52:58 ET T: 02/15/2012 11:34:59 ET JOB#: [id number]  cc: Herschell Dimes. Renae Gloss, MD, <Dictator> Teena Irani. Terance Hart, MD Salley Scarlet MD ELECTRONICALLY SIGNED 02/15/2012 16:15

## 2014-05-09 NOTE — Op Note (Signed)
PATIENT NAME:  Christopher Pineda, Christopher Pineda MR#:  [id number] DATE OF BIRTH:  Jan 22, 1926  DATE OF PROCEDURE:  05/30/2011  PROCEDURE PERFORMED: Pars plana vitrectomy of the right eye.   PREOPERATIVE DIAGNOSIS: Retained lens fragments.   POSTOPERATIVE DIAGNOSIS: Retained lens fragments.   ESTIMATED BLOOD LOSS: Less than 1 mL.   PRIMARY SURGEON: Ignacia FellingMatthew F. Emireth Cockerham, MD   ANESTHESIA: Retrobulbar block of the right eye with monitored anesthesia care.   COMPLICATIONS: None.   INDICATIONS FOR PROCEDURE: This is a patient who presented to my office after recent extracapsular cataract surgery. Examination revealed a large amount of cortical material left in the eye blocking the visual axis and allowing for elevated intraocular pressures. Risks, benefits, and alternatives of the above procedure were discussed and the patient wished to proceed.   DETAILS OF PROCEDURE: After informed consent was obtained, the patient was brought in the operative suite at [facility]. The patient was placed in supine position, was given a small dose of propofol, and a retrobulbar block was performed on the right eye by the primary surgeon without any complications. The right eye was prepped and draped in sterile manner. After lid speculum was inserted, a 23-gauge trocar was placed inferotemporally through displaced conjunctiva in an oblique fashion 3 mm beyond the limbus. Infusion cannula was turned on and inserted through the trocar and secured in position with Steri-Strips. Two more trocars were placed in a similar fashion superotemporally and superonasally with special care taken to provide at least 2 mm of distance away from the original extracap wound. The light pipe and vitreous cutter were introduced in the eye and core vitrectomy was performed. All of the cortical material was removed from the remnant capsular bag as well as much of the capsular bag was safely possible as well. Once this was completed,  peripheral vitrectomy was performed for 360 degrees. All pieces of cortical material was removed. Once the peripheral vitreous had been trimmed, a 360 degree scleral depressed exam was performed. No breaks or tears could be identified and no signs of retinal detachment could be identified. The trocars were removed and the wounds were closed using transconjunctival 6-0 plain gut. The wounds were noted to be watertight. The extracap wound was also noted to be watertight. The original conjunctival peritomy had originally peeled back secondary to manipulation by the family. Therefore, a single 6-0 plain gut stitch was placed in order to pull the peritomy back up to the limbus without complication. 8 mg of dexamethasone was given into the inferior fornix. Pressure in the eye was confirmed to be approximately 10 to 15 mmHg. The lid speculum was removed and the eye was cleaned. TobraDex was placed in the eye and a patch and shield was placed over the eye. The patient was taken to postanesthesia care with instructions to remain head up.   ____________________________ Ignacia FellingMatthew F. Champ MungoAppenzeller, MD mfa:drc D: 05/30/2011 08:04:25 ET T: 05/30/2011 08:21:43 ET JOB#: [id number]  cc: Ignacia FellingMatthew F. Champ MungoAppenzeller, MD, <Dictator> Cline CoolsMATTHEW F Destan Franchini MD ELECTRONICALLY SIGNED 06/13/2011 7:17

## 2014-05-09 NOTE — Op Note (Signed)
PATIENT NAME:  Christopher Pineda, Christopher MR#:  [id number] DATE OF BIRTH:  [birth year]  DATE OF PROCEDURE:  05/22/2011  PREOPERATIVE DIAGNOSIS: Mature cataract of the right eye.   POSTOPERATIVE DIAGNOSIS:  Mature cataract of the right eye.   PROCEDURE: Cataract extraction by extracapsular technique without phacoemulsification.   SURGEON: Dr. Galen ManilaWilliam Renada Cronin   ANESTHESIA: 1. Managed anesthesia care.  2. 50-50 mixture of a 0.75% bupivacaine and 4% Xylocaine for retrobulbar block.   COMPLICATIONS: Following removal of the lens, the irrigation-aspiration handpiece was introduced into the eye to remove remaining cortical material. At this time it was found that the capsular apparatus was significantly damaged with 6 to 8 clock hours of zonular dehiscence. There also was noted to be vitreous to the incision.   PROCEDURE IN DETAIL: Christopher Pineda was consented for this surgery in the preoperative holding area. He was then brought back to the operating room where the anesthesia team employed managed anesthesia care while 6 mL of the aforementioned mixture were placed in the right orbit without complication. The eye was prepped and draped in the usual sterile ophthalmic fashion and a Lieberman speculum was placed. A peritomy was created using sharp and blunt dissection with Westcott scissors. Calipers were used to measure a 10-mm cord length across the superior limbus. A crescent knife was then used to create a partial thickness scleral groove along this course. The same crescent blade was then used to tunnel anteriorly into the peripheral cornea. A side-port blade was used to create a paracentesis and an air bubble was placed within the eye. Vision blue dye was used to stain the anterior capsule and then DisCoVisc was used to flush the air and excess VisionBlue dye from the anterior chamber. The keratome was then used to make a small central incision through the scleral tunnel. A continuous curvilinear capsulorrhexis  was performed.  Three 9-0 nylon sutures were placed through the posterior edge of the incision. The keratome was then used to open in its entirety the internal edge of the incision. BSS on an irrigating cannula was then used to hydro dissect the lens from the capsule. The lens gave easily out of the capsule into the anterior segment. A lens loop was then used in an attempt to remove the lens from the eye. The lens was exceptionally large and difficult to remove through the incision, which then had to be enlarged further with the steel keratome. A Sinskey hook was then used through the paracentesis to push from the inferior aspect of the lens while the lens loop was used to guide the lens out of the main incision. The lens came from the eye in two halves. At that point the eye appeared to be in good condition except for some remaining cortical material within the capsular bag. The preplaced sutures were secured and the irrigation-aspiration handpiece was introduced into the eye. Immediately upon engaging the cortical material with the I/A handpiece, the majority of the capsular apparatus came forward and vitreous could be visualized. At that point the irrigation-aspiration handpiece was gently removed from the eye and anterior vitrectomy was performed. The vitrector was used to remove some of the remaining cortical material and part of the damaged capsule. Considerable anterior vitrectomy was performed until it was felt that there was no further vitreous in the anterior chamber. The central previously placed suture was removed and a sheets lens glide was placed across the pupillary sphincter. The Alcon [id number] 19-diopter lens, serial number [id number] was placed easily in the  anterior chamber and the sheets glide was removed. Additional sutures were placed through the scleral incision and the eye was inflated to a physiologic pressure with BSS. A strand of vitreous was seen to the temporal aspect of the main  incision. An iris sweep was used to reposit the vitreous strand fully back within the iris sphincter. Additional 9-0 nylon sutures were placed through the sclera. A total of six were required for the incision to be watertight. The eye was again inflated to a physiologic pressure. The wound was found to be watertight. The pupil seemed round and without vitreous strands. Miostat was placed in the anterior chamber through the paracentesis. The conjunctival peritomy was closed with 2 interrupted 9-0 Vicryl sutures. The eye was dressed with Vigamox followed by Maxitrol ointment.  The Lieberman speculum was removed and the patient was taken to the postoperative area. The details of this procedure were discussed with the awaiting family members and the patient was advised that his surgery had been complicated. I will further review the details of this case with the patient and family on one-day followup.  Tonight he is to leave the shield in place. He was given Percocet to use if necessary for ocular discomfort.  Given the patient's age and condition I recommended that the family half the pills and try one half of one 5/325 Percocet if he has ocular discomfort this evening.    ____________________________ Jerilee Field. Tyrese Ficek, MD wlp:bjt D: 05/22/2011 13:30:09 ET T: 05/22/2011 14:27:15 ET JOB#: [id number]  cc: Lora Glomski L. Keana Dueitt, MD, <Dictator>  Jerilee Field Lavenia Stumpo MD ELECTRONICALLY SIGNED 05/24/2011 17:29

## 2014-07-31 ENCOUNTER — Emergency Department
Admission: EM | Admit: 2014-07-31 | Discharge: 2014-08-01 | Disposition: A | Discharge status: Home / Self Care | Payer: Medicare Other | Attending: Emergency Medicine | Admitting: Emergency Medicine

## 2014-07-31 ENCOUNTER — Emergency Department: Payer: Medicare Other

## 2014-07-31 ENCOUNTER — Encounter: Payer: Self-pay | Admitting: *Deleted

## 2014-07-31 ENCOUNTER — Other Ambulatory Visit: Payer: Self-pay

## 2014-07-31 DIAGNOSIS — E860 Dehydration: Secondary | ICD-10-CM

## 2014-07-31 DIAGNOSIS — F0390 Unspecified dementia without behavioral disturbance: Secondary | ICD-10-CM | POA: Insufficient documentation

## 2014-07-31 DIAGNOSIS — I10 Essential (primary) hypertension: Secondary | ICD-10-CM | POA: Diagnosis not present

## 2014-07-31 DIAGNOSIS — R4182 Altered mental status, unspecified: Secondary | ICD-10-CM | POA: Diagnosis present

## 2014-07-31 DIAGNOSIS — E119 Type 2 diabetes mellitus without complications: Secondary | ICD-10-CM | POA: Diagnosis not present

## 2014-07-31 LAB — COMPREHENSIVE METABOLIC PANEL
ALK PHOS: 79 U/L (ref 38–126)
ALT: 17 U/L (ref 17–63)
AST: 43 U/L — AB (ref 15–41)
Albumin: 4.2 g/dL (ref 3.5–5.0)
Anion gap: 10 (ref 5–15)
BUN: 30 mg/dL — AB (ref 6–20)
CALCIUM: 9.5 mg/dL (ref 8.9–10.3)
CO2: 24 mmol/L (ref 22–32)
Chloride: 103 mmol/L (ref 101–111)
Creatinine, Ser: 1.76 mg/dL — ABNORMAL HIGH (ref 0.61–1.24)
GFR calc Af Amer: 39 mL/min — ABNORMAL LOW (ref >60)
GFR, EST NON AFRICAN AMERICAN: 33 mL/min — AB (ref >60)
Glucose, Bld: 183 mg/dL — ABNORMAL HIGH (ref 65–99)
Potassium: 4.1 mmol/L (ref 3.5–5.1)
SODIUM: 137 mmol/L (ref 135–145)
TOTAL PROTEIN: 7.1 g/dL (ref 6.5–8.1)
Total Bilirubin: 0.5 mg/dL (ref 0.3–1.2)

## 2014-07-31 LAB — CBC WITH DIFFERENTIAL/PLATELET
Basophils Absolute: 0.1 10*3/uL (ref 0–0.1)
Basophils Relative: 1 %
EOS PCT: 4 %
Eosinophils Absolute: 0.3 10*3/uL (ref 0–0.7)
HEMATOCRIT: 37.1 % — AB (ref 40.0–52.0)
Hemoglobin: 12.4 g/dL — ABNORMAL LOW (ref 13.0–18.0)
LYMPHS ABS: 2.4 10*3/uL (ref 1.0–3.6)
LYMPHS PCT: 25 %
MCH: 30.1 pg (ref 26.0–34.0)
MCHC: 33.5 g/dL (ref 32.0–36.0)
MCV: 89.7 fL (ref 80.0–100.0)
MONO ABS: 0.8 10*3/uL (ref 0.2–1.0)
Monocytes Relative: 8 %
Neutro Abs: 5.9 10*3/uL (ref 1.4–6.5)
Neutrophils Relative %: 62 %
Platelets: 216 10*3/uL (ref 150–440)
RBC: 4.14 MIL/uL — AB (ref 4.40–5.90)
RDW: 12.7 % (ref 11.5–14.5)
WBC: 9.5 10*3/uL (ref 3.8–10.6)

## 2014-07-31 LAB — TROPONIN I: Troponin I: 0.03 ng/mL (ref <0.031)

## 2014-07-31 MED ORDER — CEFTRIAXONE SODIUM IN DEXTROSE 20 MG/ML IV SOLN
1.0000 g | Freq: Once | INTRAVENOUS | Status: AC
  Administered 2014-07-31: 1 g via INTRAVENOUS

## 2014-07-31 MED ORDER — CEFTRIAXONE SODIUM 1 G IJ SOLR
1.0000 g | INTRAMUSCULAR | Status: DC
  Filled 2014-07-31: qty 10

## 2014-07-31 MED ORDER — LORAZEPAM 2 MG/ML IJ SOLN
1.0000 mg | Freq: Once | INTRAMUSCULAR | Status: AC
  Administered 2014-07-31: 1 mg via INTRAVENOUS
  Filled 2014-07-31: qty 1

## 2014-07-31 MED ORDER — SODIUM CHLORIDE 0.9 % IV SOLN
Freq: Once | INTRAVENOUS | Status: AC
  Administered 2014-07-31: 23:00:00 via INTRAVENOUS

## 2014-07-31 NOTE — ED Notes (Signed)
Pt to ED with onset of AMS since yesterday morning. Pt dementia, but per family more altered than normal with "poor oral intake" per EMS. Pt is awake and alert on arrival, vitals wnl, MD williams at bedside on arrival.

## 2014-07-31 NOTE — ED Provider Notes (Addendum)
[facility] Emergency Department Provider Note     Time seen: ----------------------------------------- 9:50 PM on 07/31/2014 ----------------------------------------- L5 caveat: Review of systems and history is only obtained through the family.   I have reviewed the triage vital signs and the nursing notes.   HISTORY  Chief Complaint Altered Mental Status    HPI Christopher Pineda is a 81 y.o. male who presents ER with severe dementia.According to family he has been more altered than normal. He has been more confused, gripping more tightly onto the table, decreased intake. EMS suggested he may be dehydrated or he may have a UTI. EMS suggested he may need IV fluid hydration. Patient is unable to give review of systems or report due to severe dementia. Crusting further workup for altered mental status.   Past Medical History  Diagnosis Date  . CVA (cerebral infarction) 1990  . Hypertension   . Hyperlipidemia   . Diabetes mellitus type 2 in nonobese   . Seasonal allergies   . Cataracts, bilateral   . Eye disease     eyelashes rub against eye and cause irritation  . Incontinence of urine   . Dementia     Patient Active Problem List   Diagnosis Date Noted  . TIA (transient ischemic attack) 10/16/2011  . Conjunctivitis 10/16/2011  . Urinary retention 10/16/2011  . Normocytic anemia 10/16/2011  . CVA (cerebral infarction)   . Hypertension   . Hyperlipidemia   . Diabetes mellitus type 2 in nonobese   . Seasonal allergies   . Cataracts, bilateral   . Eye disease   . Incontinence of urine   . Dementia     Past Surgical History  Procedure Laterality Date  . Cataract extraction      Allergies Review of patient's allergies indicates no known allergies.  Social History History  Substance Use Topics  . Smoking status: Never Smoker   . Smokeless tobacco: Former NeurosurgeonUser    Types: Chew  . Alcohol Use: No    Review of Systems  Neurological:  Positive for altered mental status Otherwise unknown review of systems   ____________________________________________   PHYSICAL EXAM:  VITAL SIGNS: ED Triage Vitals  Enc Vitals Group     BP --      Pulse --      Resp --      Temp --      Temp src --      SpO2 --      Weight --      Height --      Head Cir --      Peak Flow --      Pain Score --      Pain Loc --      Pain Edu? --      Excl. in GC? --     Constitutional: Alert but disoriented, no distress. Eyes: Unable to assess ENT   Head: Normocephalic and atraumatic.   Nose: No congestion/rhinnorhea.   Mouth/Throat: Mucous membranes are moist. Patient's spitting in the room at people   Neck: No stridor. Cardiovascular: Normal rate, regular rhythm. Normal and symmetric distal pulses are present in all extremities. No murmurs, rubs, or gallops. Respiratory: Normal respiratory effort without tachypnea nor retractions. Breath sounds are clear and equal bilaterally. No wheezes/rales/rhonchi. Gastrointestinal: Soft and nontender. No distention. No abdominal bruits. There is no CVA tenderness. Musculoskeletal: Nontender with normal range of motion in all extremities.  Neurologic:   No gross focal neurologic deficits are appreciated. Skin:  Skin is warm, dry and intact. No rash noted. Psychiatric: Patient does not exhibit appropriate insight or judgment.   ____________________________________________  ED COURSE:  Pertinent labs & imaging results that were available during my care of the patient were reviewed by me and considered in my medical decision making (see chart for details). Patient with acute on chronic altered mental status, likely due to dementia secondary to possible dehydration or UTI. Family wants further workup despite [place] needing to be aggressive and possibly sedate the patient. Will take this into consideration and attempt to run tests if possible. Patient is somewhat aggressive and  agitated. ____________________________________________   EKG: Normal sinus rhythm with nonspecific flat T waves. Rate is 65 bpm. Normal axis. No evidence of acute infarction.  LABS (pertinent positives/negatives)  Labs Reviewed  CBC WITH DIFFERENTIAL/PLATELET - Abnormal; Notable for the following:    RBC 4.14 (*)    Hemoglobin 12.4 (*)    HCT 37.1 (*)    All other components within normal limits  COMPREHENSIVE METABOLIC PANEL - Abnormal; Notable for the following:    Glucose, Bld 183 (*)    BUN 30 (*)    Creatinine, Ser 1.76 (*)    AST 43 (*)    GFR calc non Af Amer 33 (*)    GFR calc Af Amer 39 (*)    All other components within normal limits  URINALYSIS COMPLETEWITH MICROSCOPIC ([facility] ONLY) - Abnormal; Notable for the following:    Color, Urine YELLOW (*)    APPearance CLEAR (*)    Hgb urine dipstick 1+ (*)    Bacteria, UA RARE (*)    Squamous Epithelial / LPF 0-5 (*)    All other components within normal limits  TROPONIN I    RADIOLOGY Images were viewed by me  CT head, chest x-ray is pending  ____________________________________________  FINAL ASSESSMENT AND PLAN  Altered mental status  Plan: Final disposition and plan is pending. Patient was given 1 Rocephin 1 g IV to cover for any and UTI. Also encouraged the family to just give him by mouth liquids, but they preferred further workup. Final disposition workup is pending. Patient checked out to Dr. Carollee Massed for final disposition   Mayford Knife Cecille Amsterdam, MD   Emily Filbert, MD 07/31/14 2252  Emily Filbert, MD 07/31/14 1610  Emily Filbert, MD 08/02/14 [phone]

## 2014-07-31 NOTE — ED Notes (Signed)
ivf infusing without difficulty. Family at bedside.

## 2014-07-31 NOTE — ED Notes (Signed)
MD Mayford KnifeWilliams at bedside with family discussing treatment plan.

## 2014-08-01 ENCOUNTER — Emergency Department: Payer: Medicare Other

## 2014-08-01 DIAGNOSIS — F0390 Unspecified dementia without behavioral disturbance: Secondary | ICD-10-CM | POA: Diagnosis not present

## 2014-08-01 LAB — URINALYSIS COMPLETE WITH MICROSCOPIC (ARMC ONLY)
BILIRUBIN URINE: NEGATIVE
Glucose, UA: NEGATIVE mg/dL
KETONES UR: NEGATIVE mg/dL
LEUKOCYTES UA: NEGATIVE
Nitrite: NEGATIVE
PH: 5 (ref 5.0–8.0)
Protein, ur: NEGATIVE mg/dL
Specific Gravity, Urine: 1.012 (ref 1.005–1.030)

## 2014-08-01 NOTE — ED Notes (Signed)
Spoke with dr. Carollee Massedkaminski regarding pt's agitation and difficulty keeping monitoring equipment in place. Dr. Carollee Massedkaminski states "it's fine to take it off". Will discuss with family.

## 2014-08-01 NOTE — ED Notes (Signed)
MD at bedside. 

## 2014-08-01 NOTE — Discharge Instructions (Signed)
BUN is 30, creatinine is 1.8, urinalysis is normal without infection and at a normal concentration. Head CT did not show any new findings.  Follow-up with your regular doctor. Return to the emergency department if there are any further worrisome symptoms or other urgent concerns.  Dementia Dementia is a word that is used to describe problems with the brain and how it works. People with dementia have memory loss. They may also have problems with thinking, speaking, or solving problems. It can affect how they act around people, how they do their job, their mood, and their personality. These changes may not show up for a long time. Family or friends may not notice problems in the early part of this disease. HOME CARE The following tips are for the person living with, or caring for, the person with dementia. Make the home safe.  Remove locks on bathroom doors.  Use childproof locks on cabinets where alcohol, cleaning supplies, or chemicals are stored.  Put outlet covers in electrical outlets.  Put in childproof locks to keep doors and windows safe.  Remove stove knobs, or put in safety knobs that shut off on their own.  Lower the temperature on water heaters.  Label medicines. Lock them in a safe place.  Keep knives, lighters, matches, power tools, and guns out of reach or in a safe place.  Remove objects that might break or can hurt the person.  Make sure lighting is good inside and outside.  Put in grab bars if needed.  Use a device that detects falls or other needs for help. Lessen confusion.  Keep familiar objects and people around.  Use night lights or low lit (dim) lights at night.  Label objects or areas.  Use reminders, notes, or directions for daily activities or tasks.  Keep a simple routine that is the same for waking, meals, bathing, dressing, and bedtime.  Create a calm and quiet home.  Put up clocks and calendars.  Keep emergency numbers and the home address  near all phones.  Help show the different times of day. Open the curtains during the day to let light in. Speak clearly and directly.  Choose simple words and short sentences.  Use a gentle, calm voice.  Do not interrupt.  If the person has a hard time finding a word to use, give them the word or thought.  Ask 1 question at a time. Give enough time for the person to answer. Repeat the question if the person does not answer. Do things that lessen restlessness.  Provide a comfortable bed.  Have the same bedtime routine every night.  Have a regular walking and activity schedule.  Lessen naps during the day.  Do not let the person drink a lot of caffeine.  Go to events that are not overwhelming. Eat well and drink fluids.  Lessen distractions during meal times and snacks.  Avoid foods that are too hot or too cold.  Watch how the person chews and swallows. This is to make sure they do not choke. Other  Keep all vision, hearing, dental, and medical visits with the doctor.  Only give medicines as told by the doctor.  Watch the person's driving ability. Do not let the person drive if he or she cannot drive safely.  Use a program that helps find a person if they become missing. You may need to register with this program. GET HELP RIGHT AWAY IF:   A fever of 102 F (38.9 C) develops.  Confusion  develops or gets worse.  Sleepiness develops or gets worse.  Staying awake is hard to do.  New behavior problems start like mood swings, aggression, and seeing things that are not there.  Problems with balance, speech, or falling develop.  Problems swallowing develop.  Any problems of another sickness develop. MAKE SURE YOU:  Understand these instructions.  Will watch his or her condition.  Will get help right away if he or she is not doing well or gets worse. Document Released: 12/15/2007 Document Revised: 03/26/2011 Document Reviewed: 05/29/2010 [facility] Patient  Information 2015 [employer] [place]. This information is not intended to replace advice given to you by your health care provider. Make sure you discuss any questions you have with your health care provider.

## 2014-08-01 NOTE — ED Notes (Signed)
Pt continues to attempt to remove monitoring equipment, but is easily redirected at times by granddaughter. Pt lying on right side.

## 2014-08-01 NOTE — ED Notes (Signed)
Pt placed on cardiac monitor and cont pox. Pt agitated attempting to remove monitoring equipment.

## 2014-08-01 NOTE — ED Notes (Signed)
Family at bedside. Pt on side with covers over head, resting. Pt tenses arms and appears uncomfortable when blood pressure obtained, difficulty noted with obtaining of blood pressure when cuff inflated, pt keeps arms very rigid. disscussed obtaining urine sample with Angelica ChessmanMandy, pt's granddaughter, will attempt urinal.

## 2014-08-01 NOTE — ED Notes (Signed)
Pt more calm, family at bedside, pt does not become agitated when touched by ed staff at this time. Will obtain manual blood pressure. Pox remains on toe

## 2014-08-01 NOTE — ED Notes (Signed)
Pt to car at 0320, unable to complete discharge paperwork until 0340 in computer.

## 2014-08-01 NOTE — ED Notes (Signed)
Pt agitated, removing ekg monitoring and attempting to remove blood pressure cuff. Pox placed on toe. Discussed with family will remove monitoring equipment at this time to lessen agitation.

## 2014-08-01 NOTE — ED Notes (Signed)
Pt agitated, family at bedside discussing treatment plan after discussing with dr. Mayford KnifeWilliams. This RN in to attempt to start iv per pt's family's request.

## 2014-08-01 NOTE — ED Notes (Signed)
Attempt to obtain blood pressure, pt very agitated when obtaining blood pressure. Pt spitting, kicking, swinging fists. Will attempt manual blood pressure after pt calms.

## 2014-08-01 NOTE — ED Provider Notes (Signed)
Accepted care of this patient from Dr. Daryel NovemberJonathan Pineda. Please see his H&P for the background on this patient's emergency department visit today.  This patient has dementia. He has presented to the emergency department with the family due to their concerns for a change in his mental status.  ____________________________________  Labs:  Labs Reviewed  CBC WITH DIFFERENTIAL/PLATELET - Abnormal; Notable for the following:    RBC 4.14 (*)    Hemoglobin 12.4 (*)    HCT 37.1 (*)    All other components within normal limits  COMPREHENSIVE METABOLIC PANEL - Abnormal; Notable for the following:    Glucose, Bld 183 (*)    BUN 30 (*)    Creatinine, Ser 1.76 (*)    AST 43 (*)    GFR calc non Af Amer 33 (*)    GFR calc Af Amer 39 (*)    All other components within normal limits  URINALYSIS COMPLETEWITH MICROSCOPIC ([facility] ONLY) - Abnormal; Notable for the following:    Color, Urine YELLOW (*)    APPearance CLEAR (*)    Hgb urine dipstick 1+ (*)    Bacteria, UA RARE (*)    Squamous Epithelial / LPF 0-5 (*)    All other components within normal limits  TROPONIN I     __________________________________________  Radiology:  CT head: No acute intracranial abnormalities. There is chronic atrophy and small vessel ischemic changes. There is opacification of the right maxillary sinus.  X-ray chest: Shallow inspiration with atelectasis. Cardiac enlargement.  ----------------------------------------- 2:29 AM on 08/01/2014 -----------------------------------------  The urinalysis is now resulted as well. That is normal without sign of infection.   At this time, the patient is sleeping. His family reports he appears to be stable. He has his "Ups & Downs". They were concerned that he might be dehydrated. We discussed the BUN and creatinine levels. He has received a bolus of IV fluids as well as a dose of ceftriaxone. With no sign of current infection, we will not continue antibiotics.  The  family is comfortable returning home with the patient this time. He has a physician who comes to the house. We will discharge him home for ongoing care.  Diagnosis: Dementia, chronic Weakness, general, acute Dehydration  Christopher Ramusavid W Kloee Ballew, MD 08/01/14 [phone]

## 2014-08-01 NOTE — ED Notes (Signed)
Patient transported to CT 

## 2015-09-16 DEATH — deceased

## 2016-01-15 IMAGING — CT CT HEAD W/O CM
2 series · 16 of 30 positions shown, 19 images · non-contrast
Comparison: 02/12/2012

CLINICAL DATA: Altered mental status. History of dementia with
increased confusion.

EXAM:
CT HEAD WITHOUT CONTRAST
TECHNIQUE: Contiguous axial images were obtained from the base of the skull
through the vertex without intravenous contrast.

[Series 2: head wo · axial · 0.38mm/px · z∈[+242,+367]mm · 11 of 31 slices shown, 14 images (1 of 2)]
[im 3/31  brain]
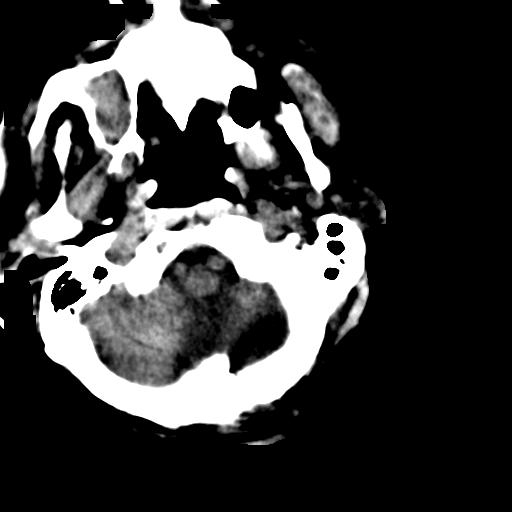
[im 3/31  bone]
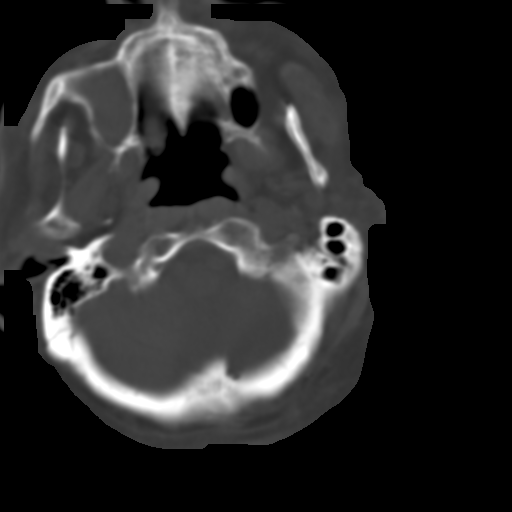
[im 6/31  brain]
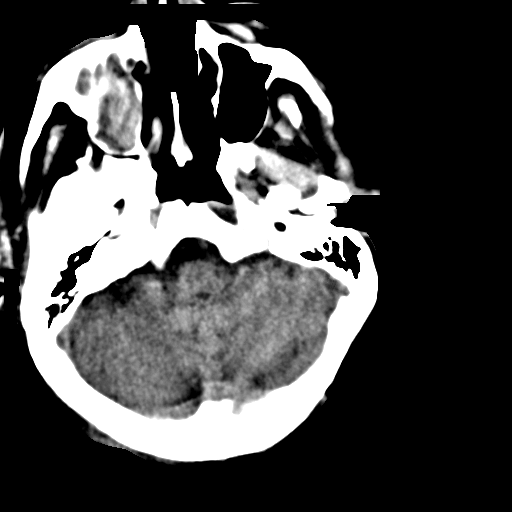
[im 8/31  brain]
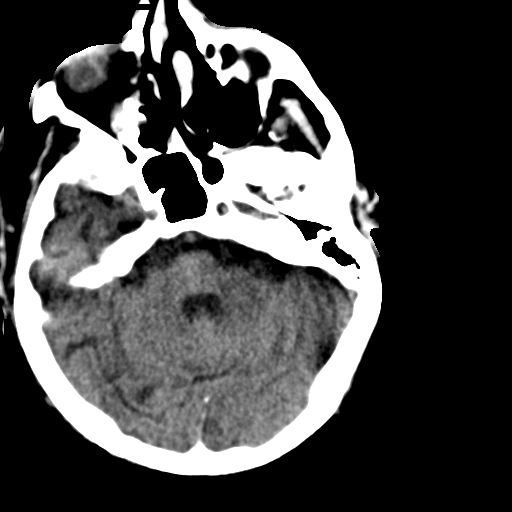
[im 11/31  brain]
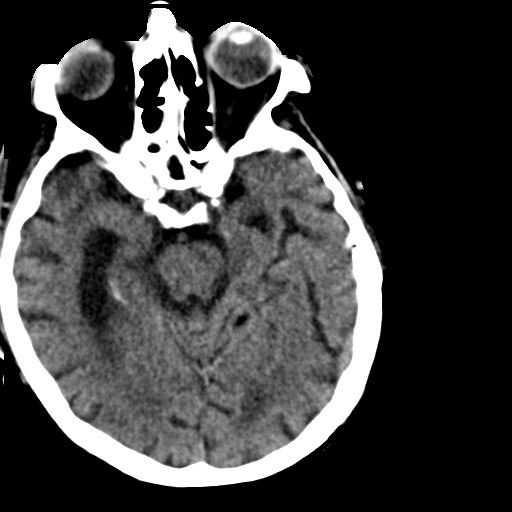
[im 13/31  brain]
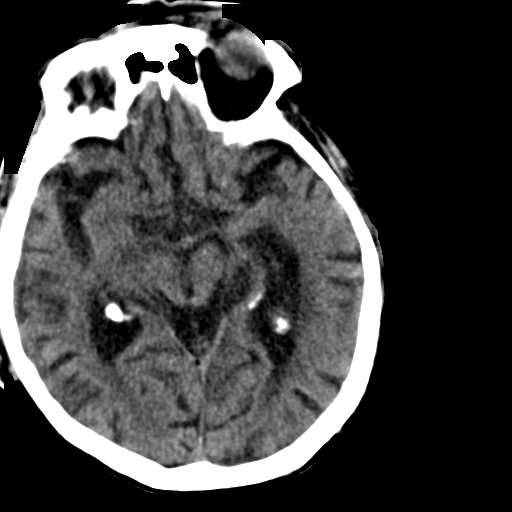
[im 13/31  bone]
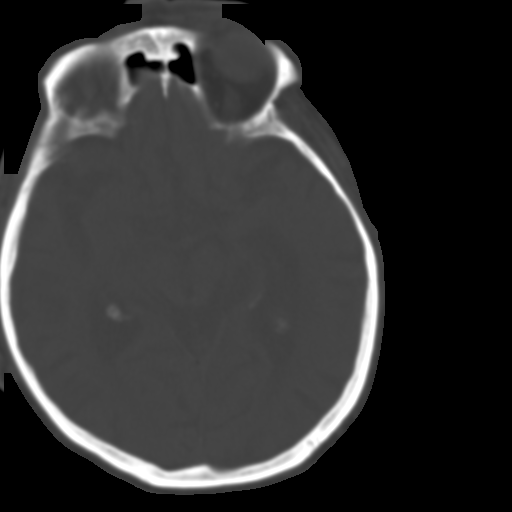
[im 16/31  brain]
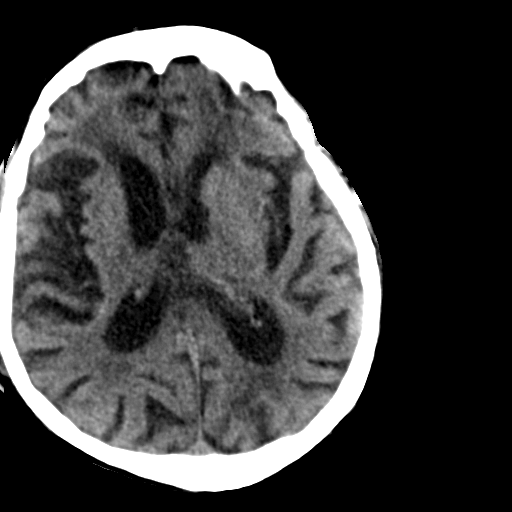
[im 18/31  brain]
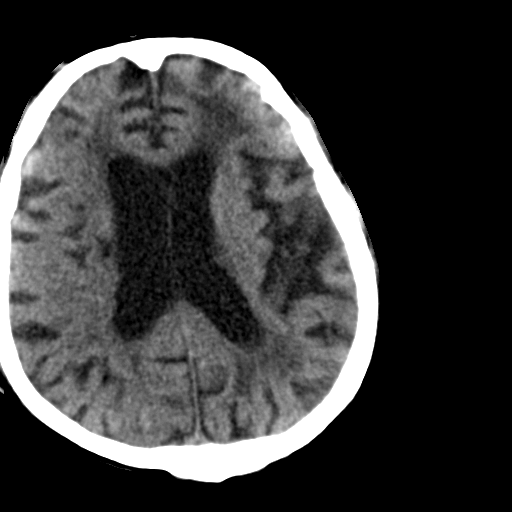
[im 21/31  brain]
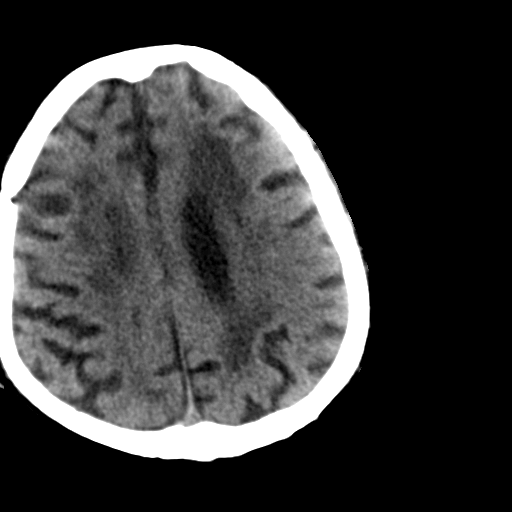
[im 23/31  brain]
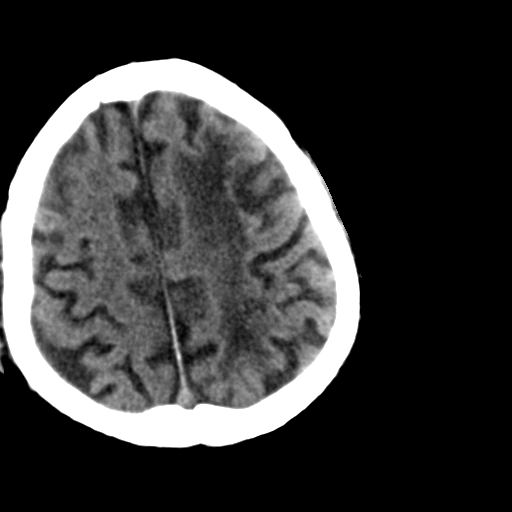
[im 23/31  bone]
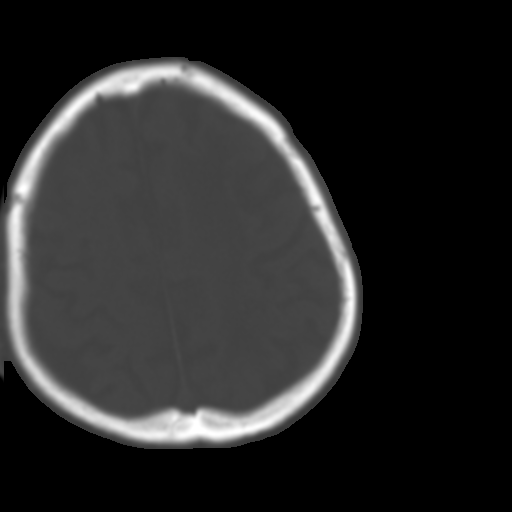
[im 26/31  brain]
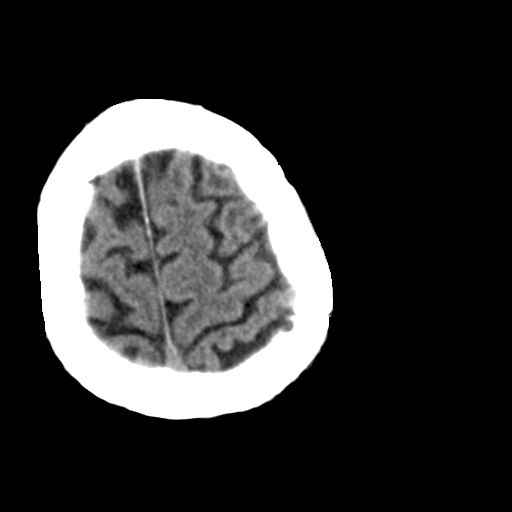
[im 28/31  brain]
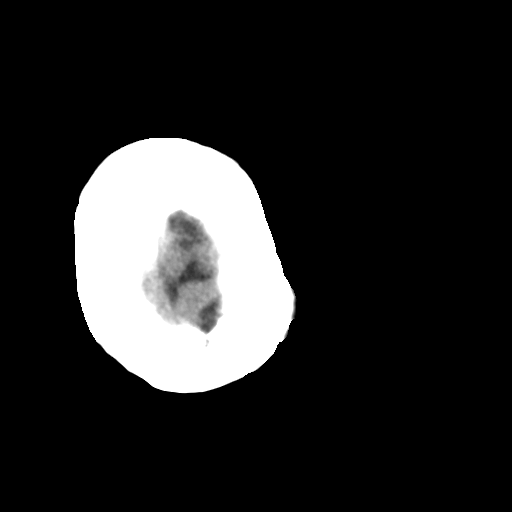

[Series 4: head wo · axial · 0.38mm/px · z∈[+290,+357]mm · 5 of 39 slices shown (2 of 2)]
[im 3/39  brain]
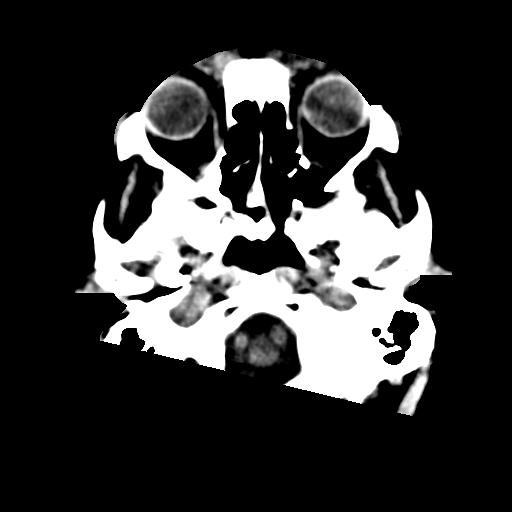
[im 8/39  brain]
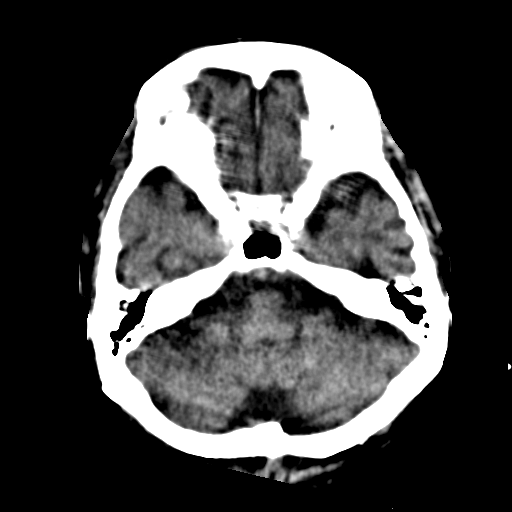
[im 12/39  brain]
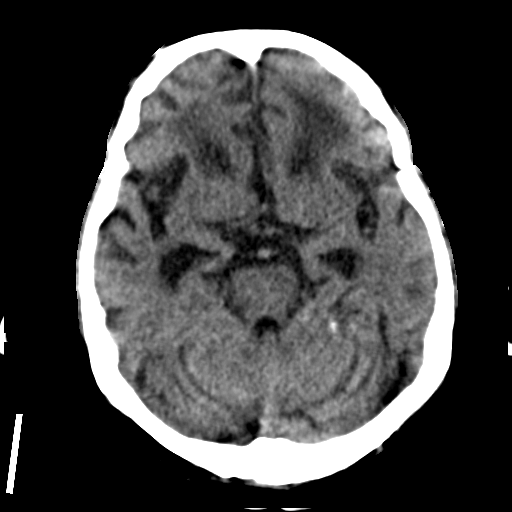
[im 17/39  brain]
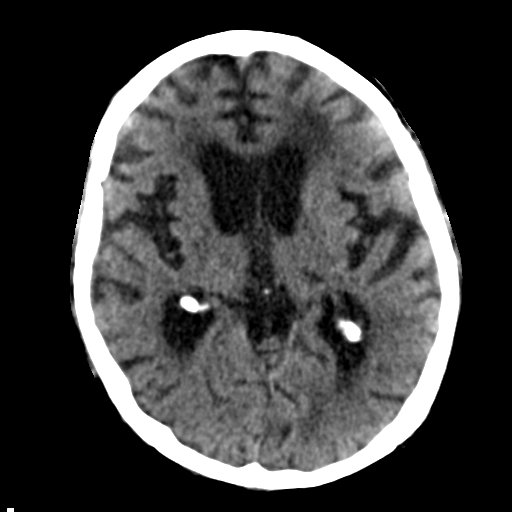
[im 22/39  brain]
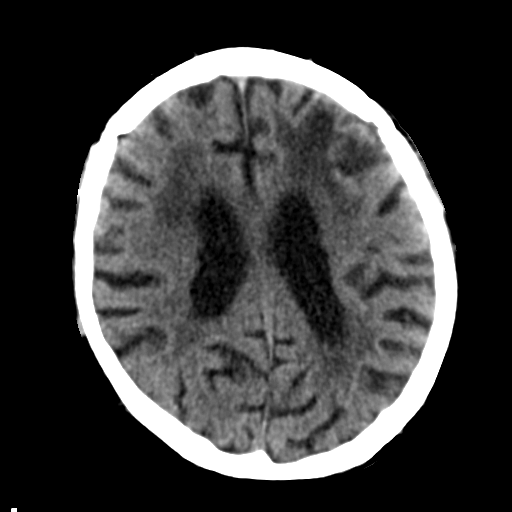

[16 of 30 positions shown; findings below may reference images not displayed]

FINDINGS: Diffuse cerebral atrophy. Ventricular dilatation consistent with
central atrophy. Extensive low-attenuation changes throughout the
deep white matter consistent with small vessel ischemia. No mass
effect or midline shift. No abnormal extra-axial fluid collections.
Gray-white matter junctions are distinct. Basal cisterns are not
effaced. No evidence of acute intracranial hemorrhage. No depressed
skull fractures. Opacification of the right maxillary antrum with
mucosal thickening in the paranasal sinuses otherwise. Mastoid air
cells are not opacified.
IMPRESSION: No acute intracranial abnormalities. Chronic atrophy and small
vessel ischemic changes. Opacification of the right maxillary sinus.
